# Patient Record
Sex: Male | Born: 1985 | Race: White | Hispanic: No | Marital: Single | Smoking: Never smoker
Health system: Southern US, Community
[De-identification: ages and names within clinical notes are randomized; demographics above are authoritative.]

## PROBLEM LIST (undated history)

## (undated) DIAGNOSIS — F429 Obsessive-compulsive disorder, unspecified: Secondary | ICD-10-CM

## (undated) DIAGNOSIS — R569 Unspecified convulsions: Secondary | ICD-10-CM

## (undated) DIAGNOSIS — F819 Developmental disorder of scholastic skills, unspecified: Secondary | ICD-10-CM

## (undated) DIAGNOSIS — F419 Anxiety disorder, unspecified: Secondary | ICD-10-CM

## (undated) DIAGNOSIS — F209 Schizophrenia, unspecified: Secondary | ICD-10-CM

## (undated) DIAGNOSIS — F840 Autistic disorder: Secondary | ICD-10-CM

## (undated) HISTORY — PX: TYMPANOSTOMY TUBE PLACEMENT: SHX32

## (undated) HISTORY — PX: SURGERY SCROTAL / TESTICULAR: SUR1316

## (undated) HISTORY — PX: OTHER SURGICAL HISTORY: SHX169

## (undated) HISTORY — PX: APPENDECTOMY: SHX54

## (undated) HISTORY — PX: INGUINAL HERNIA REPAIR: SUR1180

---
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
## 2007-07-23 ENCOUNTER — Ambulatory Visit (HOSPITAL_COMMUNITY): Admission: RE | Admit: 2007-07-23 | Discharge: 2007-07-23 | Payer: Self-pay | Admitting: Pediatrics

## 2010-08-03 ENCOUNTER — Emergency Department: Payer: Self-pay | Admitting: Emergency Medicine

## 2010-08-31 ENCOUNTER — Emergency Department: Payer: Self-pay | Admitting: Emergency Medicine

## 2011-04-16 ENCOUNTER — Emergency Department: Payer: Self-pay | Admitting: Internal Medicine

## 2011-06-26 ENCOUNTER — Emergency Department: Payer: Self-pay

## 2011-11-06 ENCOUNTER — Emergency Department: Payer: Self-pay | Admitting: Emergency Medicine

## 2011-11-06 LAB — CBC
HGB: 15.1 g/dL (ref 13.0–18.0)
MCV: 88 fL (ref 80–100)
Platelet: 246 10*3/uL (ref 150–440)
WBC: 7 10*3/uL (ref 3.8–10.6)

## 2011-11-06 LAB — COMPREHENSIVE METABOLIC PANEL
Alkaline Phosphatase: 90 U/L (ref 50–136)
Bilirubin,Total: 0.4 mg/dL (ref 0.2–1.0)
EGFR (African American): >60
Glucose: 165 mg/dL — ABNORMAL HIGH (ref 65–99)
SGPT (ALT): 25 U/L
Sodium: 140 mmol/L (ref 136–145)

## 2012-10-31 DIAGNOSIS — J45909 Unspecified asthma, uncomplicated: Secondary | ICD-10-CM | POA: Insufficient documentation

## 2013-01-23 ENCOUNTER — Emergency Department: Payer: Self-pay | Admitting: Emergency Medicine

## 2014-04-23 ENCOUNTER — Emergency Department: Payer: Self-pay | Admitting: Emergency Medicine

## 2014-07-20 ENCOUNTER — Emergency Department: Payer: Self-pay | Admitting: Emergency Medicine

## 2014-07-22 ENCOUNTER — Ambulatory Visit: Payer: Self-pay | Admitting: Internal Medicine

## 2014-07-24 ENCOUNTER — Emergency Department: Payer: Self-pay | Admitting: Emergency Medicine

## 2014-08-13 DIAGNOSIS — Q75009 Craniosynostosis unspecified: Secondary | ICD-10-CM | POA: Insufficient documentation

## 2014-08-13 DIAGNOSIS — Q750 Craniosynostosis: Secondary | ICD-10-CM | POA: Insufficient documentation

## 2015-02-17 ENCOUNTER — Observation Stay
Admission: EM | Admit: 2015-02-17 | Discharge: 2015-02-19 | Disposition: A | Discharge status: Home / Self Care | Payer: Medicare Other | Attending: Surgery | Admitting: Surgery

## 2015-02-17 ENCOUNTER — Encounter: Payer: Self-pay | Admitting: Emergency Medicine

## 2015-02-17 DIAGNOSIS — F840 Autistic disorder: Secondary | ICD-10-CM | POA: Diagnosis not present

## 2015-02-17 DIAGNOSIS — K3580 Unspecified acute appendicitis: Secondary | ICD-10-CM

## 2015-02-17 DIAGNOSIS — K353 Acute appendicitis with localized peritonitis, without perforation or gangrene: Secondary | ICD-10-CM | POA: Diagnosis present

## 2015-02-17 DIAGNOSIS — K5900 Constipation, unspecified: Secondary | ICD-10-CM | POA: Insufficient documentation

## 2015-02-17 DIAGNOSIS — Z882 Allergy status to sulfonamides status: Secondary | ICD-10-CM | POA: Diagnosis not present

## 2015-02-17 DIAGNOSIS — R112 Nausea with vomiting, unspecified: Secondary | ICD-10-CM | POA: Diagnosis not present

## 2015-02-17 DIAGNOSIS — Z79899 Other long term (current) drug therapy: Secondary | ICD-10-CM | POA: Insufficient documentation

## 2015-02-17 DIAGNOSIS — K429 Umbilical hernia without obstruction or gangrene: Secondary | ICD-10-CM

## 2015-02-17 DIAGNOSIS — Z9889 Other specified postprocedural states: Secondary | ICD-10-CM | POA: Diagnosis not present

## 2015-02-17 DIAGNOSIS — R1031 Right lower quadrant pain: Secondary | ICD-10-CM | POA: Diagnosis not present

## 2015-02-17 HISTORY — DX: Autistic disorder: F84.0

## 2015-02-17 LAB — CBC
HEMATOCRIT: 41.6 % (ref 40.0–52.0)
HEMOGLOBIN: 14.2 g/dL (ref 13.0–18.0)
MCH: 29.1 pg (ref 26.0–34.0)
MCHC: 34 g/dL (ref 32.0–36.0)
MCV: 85.5 fL (ref 80.0–100.0)
Platelets: 222 10*3/uL (ref 150–440)
RBC: 4.87 MIL/uL (ref 4.40–5.90)
RDW: 13.2 % (ref 11.5–14.5)
WBC: 9.7 10*3/uL (ref 3.8–10.6)

## 2015-02-17 LAB — URINALYSIS COMPLETE WITH MICROSCOPIC (ARMC ONLY)
Bilirubin Urine: NEGATIVE
GLUCOSE, UA: NEGATIVE mg/dL
Hgb urine dipstick: NEGATIVE
KETONES UR: NEGATIVE mg/dL
LEUKOCYTES UA: NEGATIVE
Nitrite: NEGATIVE
PH: 7 (ref 5.0–8.0)
Protein, ur: NEGATIVE mg/dL
Specific Gravity, Urine: 1.019 (ref 1.005–1.030)

## 2015-02-17 LAB — COMPREHENSIVE METABOLIC PANEL
ALK PHOS: 73 U/L (ref 38–126)
ALT: 14 U/L — AB (ref 17–63)
AST: 13 U/L — ABNORMAL LOW (ref 15–41)
Albumin: 3.9 g/dL (ref 3.5–5.0)
Anion gap: 8 (ref 5–15)
BILIRUBIN TOTAL: 0.6 mg/dL (ref 0.3–1.2)
BUN: 13 mg/dL (ref 6–20)
CALCIUM: 8.9 mg/dL (ref 8.9–10.3)
CO2: 28 mmol/L (ref 22–32)
Chloride: 104 mmol/L (ref 101–111)
Creatinine, Ser: 1.12 mg/dL (ref 0.61–1.24)
GFR calc Af Amer: >60 mL/min (ref >60)
Glucose, Bld: 124 mg/dL — ABNORMAL HIGH (ref 65–99)
POTASSIUM: 3.9 mmol/L (ref 3.5–5.1)
Sodium: 140 mmol/L (ref 135–145)
Total Protein: 7.2 g/dL (ref 6.5–8.1)

## 2015-02-17 LAB — LIPASE, BLOOD: LIPASE: 24 U/L (ref 22–51)

## 2015-02-17 NOTE — ED Notes (Signed)
Pt to triage via w/c with no distress noted; hx autism; Mother reports N/V last several days with right lower abd pain; denies constipation or urinary c/o

## 2015-02-18 ENCOUNTER — Emergency Department: Payer: Medicare Other

## 2015-02-18 ENCOUNTER — Observation Stay: Payer: Medicare Other | Admitting: Anesthesiology

## 2015-02-18 ENCOUNTER — Encounter
Admission: EM | Disposition: A | Discharge status: Home / Self Care | Payer: Self-pay | Source: Home / Self Care | Attending: Emergency Medicine

## 2015-02-18 DIAGNOSIS — K429 Umbilical hernia without obstruction or gangrene: Secondary | ICD-10-CM | POA: Diagnosis not present

## 2015-02-18 DIAGNOSIS — K353 Acute appendicitis with localized peritonitis, without perforation or gangrene: Secondary | ICD-10-CM | POA: Diagnosis present

## 2015-02-18 HISTORY — PX: LAPAROSCOPIC APPENDECTOMY: SHX408

## 2015-02-18 LAB — SURGICAL PCR SCREEN
MRSA, PCR: NEGATIVE
Staphylococcus aureus: NEGATIVE

## 2015-02-18 SURGERY — APPENDECTOMY, LAPAROSCOPIC
Anesthesia: General | Wound class: Contaminated

## 2015-02-18 MED ORDER — ONDANSETRON HCL 4 MG PO TABS: 4.0000 mg | ORAL_TABLET | Freq: Four times a day (QID) | ORAL | Status: DC | PRN

## 2015-02-18 MED ORDER — SUCCINYLCHOLINE CHLORIDE 20 MG/ML IJ SOLN
INTRAMUSCULAR | Status: DC | PRN
  Administered 2015-02-18: 120 mg via INTRAVENOUS

## 2015-02-18 MED ORDER — PROPOFOL 10 MG/ML IV BOLUS
INTRAVENOUS | Status: DC | PRN
  Administered 2015-02-18: 150 mg via INTRAVENOUS

## 2015-02-18 MED ORDER — BUPIVACAINE HCL 0.25 % IJ SOLN
INTRAMUSCULAR | Status: DC | PRN
  Administered 2015-02-18: 15 mL

## 2015-02-18 MED ORDER — HEPARIN SODIUM (PORCINE) 5000 UNIT/ML IJ SOLN
5000.0000 [IU] | Freq: Three times a day (TID) | INTRAMUSCULAR | Status: DC
  Administered 2015-02-18 – 2015-02-19 (×4): 5000 [IU] via SUBCUTANEOUS
  Filled 2015-02-18 (×4): qty 1

## 2015-02-18 MED ORDER — LORAZEPAM 2 MG/ML IJ SOLN
0.5000 mg | INTRAMUSCULAR | Status: DC | PRN
  Administered 2015-02-18: 0.5 mg via INTRAVENOUS
  Filled 2015-02-18: qty 1

## 2015-02-18 MED ORDER — GLYCOPYRROLATE 0.2 MG/ML IJ SOLN
INTRAMUSCULAR | Status: DC | PRN
  Administered 2015-02-18: 0.6 mg via INTRAVENOUS
  Administered 2015-02-18: 0.2 mg via INTRAVENOUS

## 2015-02-18 MED ORDER — MORPHINE SULFATE 2 MG/ML IJ SOLN
INTRAMUSCULAR | Status: AC
  Administered 2015-02-18: 2 mg via INTRAVENOUS
  Filled 2015-02-18: qty 1

## 2015-02-18 MED ORDER — LACTATED RINGERS IV SOLN
Freq: Once | INTRAVENOUS | Status: AC
  Administered 2015-02-18: 02:00:00 via INTRAVENOUS

## 2015-02-18 MED ORDER — MORPHINE SULFATE 2 MG/ML IJ SOLN
2.0000 mg | INTRAMUSCULAR | Status: DC | PRN
  Administered 2015-02-18 – 2015-02-19 (×3): 2 mg via INTRAVENOUS
  Filled 2015-02-18 (×2): qty 1

## 2015-02-18 MED ORDER — ONDANSETRON HCL 4 MG/2ML IJ SOLN
INTRAMUSCULAR | Status: AC
  Administered 2015-02-18: 4 mg via INTRAVENOUS
  Filled 2015-02-18: qty 2

## 2015-02-18 MED ORDER — DEXTROSE-NACL 5-0.9 % IV SOLN
INTRAVENOUS | Status: DC
  Administered 2015-02-18 (×2): via INTRAVENOUS

## 2015-02-18 MED ORDER — LABETALOL HCL 5 MG/ML IV SOLN
INTRAVENOUS | Status: DC | PRN
  Administered 2015-02-18: 10 mg via INTRAVENOUS

## 2015-02-18 MED ORDER — IOHEXOL 300 MG/ML  SOLN
100.0000 mL | Freq: Once | INTRAMUSCULAR | Status: AC | PRN
  Administered 2015-02-18: 100 mL via INTRAVENOUS

## 2015-02-18 MED ORDER — IOHEXOL 240 MG/ML SOLN
25.0000 mL | Freq: Once | INTRAMUSCULAR | Status: AC | PRN
  Administered 2015-02-18: 25 mL via ORAL

## 2015-02-18 MED ORDER — FENTANYL CITRATE (PF) 100 MCG/2ML IJ SOLN
INTRAMUSCULAR | Status: DC | PRN
  Administered 2015-02-18 (×2): 50 ug via INTRAVENOUS

## 2015-02-18 MED ORDER — ONDANSETRON HCL 4 MG/2ML IJ SOLN
4.0000 mg | Freq: Four times a day (QID) | INTRAMUSCULAR | Status: DC | PRN
  Administered 2015-02-18 – 2015-02-19 (×4): 4 mg via INTRAVENOUS
  Filled 2015-02-18 (×2): qty 2

## 2015-02-18 MED ORDER — PALIPERIDONE ER 3 MG PO TB24: 1.5000 mg | ORAL_TABLET | Freq: Every evening | ORAL | Status: DC

## 2015-02-18 MED ORDER — FENTANYL CITRATE (PF) 100 MCG/2ML IJ SOLN
25.0000 ug | INTRAMUSCULAR | Status: DC | PRN
Start: 2015-02-18 — End: 2015-02-19
  Administered 2015-02-18 (×3): 25 ug via INTRAVENOUS

## 2015-02-18 MED ORDER — MIDAZOLAM HCL 2 MG/2ML IJ SOLN
INTRAMUSCULAR | Status: DC | PRN
  Administered 2015-02-18: 1 mg via INTRAVENOUS

## 2015-02-18 MED ORDER — SODIUM CHLORIDE 0.9 % IV SOLN
INTRAVENOUS | Status: DC | PRN
  Administered 2015-02-18: 09:00:00 via INTRAVENOUS

## 2015-02-18 MED ORDER — ROCURONIUM BROMIDE 100 MG/10ML IV SOLN
INTRAVENOUS | Status: DC | PRN
  Administered 2015-02-18: 10 mg via INTRAVENOUS
  Administered 2015-02-18: 30 mg via INTRAVENOUS

## 2015-02-18 MED ORDER — ONDANSETRON HCL 4 MG/2ML IJ SOLN
4.0000 mg | Freq: Once | INTRAMUSCULAR | Status: AC | PRN
  Administered 2015-02-18: 4 mg via INTRAVENOUS

## 2015-02-18 MED ORDER — LACTATED RINGERS IV SOLN
INTRAVENOUS | Status: DC | PRN
  Administered 2015-02-18: 09:00:00 via INTRAVENOUS

## 2015-02-18 MED ORDER — LIDOCAINE HCL (CARDIAC) 20 MG/ML IV SOLN
INTRAVENOUS | Status: DC | PRN
  Administered 2015-02-18: 30 mg via INTRAVENOUS

## 2015-02-18 MED ORDER — PIPERACILLIN-TAZOBACTAM 3.375 G IVPB 30 MIN
3.3750 g | Freq: Three times a day (TID) | INTRAVENOUS | Status: DC
  Administered 2015-02-18 – 2015-02-19 (×4): 3.375 g via INTRAVENOUS
  Filled 2015-02-18 (×8): qty 50

## 2015-02-18 MED ORDER — NEOSTIGMINE METHYLSULFATE 10 MG/10ML IV SOLN
INTRAVENOUS | Status: DC | PRN
  Administered 2015-02-18: 3 mg via INTRAVENOUS

## 2015-02-18 SURGICAL SUPPLY — 47 items
APL SKNCLS STERI-STRIP NONHPOA (GAUZE/BANDAGES/DRESSINGS) ×1
BENZOIN TINCTURE PRP APPL 2/3 (GAUZE/BANDAGES/DRESSINGS) ×2 IMPLANT
BLADE CLIPPER SURG (BLADE) ×2 IMPLANT
CANISTER SUCT 1200ML W/VALVE (MISCELLANEOUS) ×3 IMPLANT
CATH FOL 2WAY LX 14X5 (CATHETERS) ×2 IMPLANT
CHLORAPREP W/TINT 26ML (MISCELLANEOUS) ×3 IMPLANT
CLOSURE WOUND 1/2 X4 (GAUZE/BANDAGES/DRESSINGS) ×1
CUTTER LINEAR ENDO 35 ART FLEX (STAPLE) ×3 IMPLANT
CUTTER LINEAR ENDO 35 ETS (STAPLE) ×3 IMPLANT
DECANTER SPIKE VIAL GLASS SM (MISCELLANEOUS) ×2 IMPLANT
DEFOGGER SCOPE WARMER CLEARIFY (MISCELLANEOUS) ×3 IMPLANT
DRAPE SHEET LG 3/4 BI-LAMINATE (DRAPES) ×3 IMPLANT
DRAPE UTILITY 15X26 TOWEL STRL (DRAPES) ×6 IMPLANT
DRESSING TELFA 4X3 1S ST N-ADH (GAUZE/BANDAGES/DRESSINGS) ×3 IMPLANT
DRSG TEGADERM 2-3/8X2-3/4 SM (GAUZE/BANDAGES/DRESSINGS) ×5 IMPLANT
ELECT CAUTERY BLADE 6.4 (BLADE) ×2 IMPLANT
ENDOPOUCH RETRIEVER 10 (MISCELLANEOUS) ×3 IMPLANT
GAUZE SPONGE 4X4 12PLY STRL (GAUZE/BANDAGES/DRESSINGS) ×1 IMPLANT
GLOVE BIO SURGEON STRL SZ7.5 (GLOVE) ×7 IMPLANT
GOWN STRL REUS W/ TWL LRG LVL3 (GOWN DISPOSABLE) ×1 IMPLANT
GOWN STRL REUS W/ TWL XL LVL3 (GOWN DISPOSABLE) ×1 IMPLANT
GOWN STRL REUS W/TWL LRG LVL3 (GOWN DISPOSABLE) ×3
GOWN STRL REUS W/TWL XL LVL3 (GOWN DISPOSABLE) ×3
IRRIGATION STRYKERFLOW (MISCELLANEOUS) ×1 IMPLANT
IRRIGATOR STRYKERFLOW (MISCELLANEOUS) ×3
IV NS 1000ML (IV SOLUTION) ×3
IV NS 1000ML BAXH (IV SOLUTION) ×1 IMPLANT
LABEL OR SOLS (LABEL) ×1 IMPLANT
NDL SAFETY 25GX1.5 (NEEDLE) ×3 IMPLANT
NS IRRIG 500ML POUR BTL (IV SOLUTION) ×3 IMPLANT
PACK LAP CHOLECYSTECTOMY (MISCELLANEOUS) ×3 IMPLANT
PAD GROUND ADULT SPLIT (MISCELLANEOUS) ×3 IMPLANT
PENCIL ELECTRO HAND CTR (MISCELLANEOUS) ×2 IMPLANT
RELOAD CUTTER ETS 35MM STAND (ENDOMECHANICALS) ×3 IMPLANT
SCISSORS METZENBAUM CVD 33 (INSTRUMENTS) ×3 IMPLANT
SHEARS HARMONIC ACE PLUS 36CM (ENDOMECHANICALS) ×3 IMPLANT
SLEEVE ENDOPATH XCEL 5M (ENDOMECHANICALS) ×3 IMPLANT
STRAP SAFETY BODY (MISCELLANEOUS) ×3 IMPLANT
STRIP CLOSURE SKIN 1/2X4 (GAUZE/BANDAGES/DRESSINGS) ×2 IMPLANT
SUT VIC AB 0 UR5 27 (SUTURE) ×6 IMPLANT
SUT VIC AB 4-0 RB1 27 (SUTURE) ×3
SUT VIC AB 4-0 RB1 27X BRD (SUTURE) ×1 IMPLANT
SWABSTK COMLB BENZOIN TINCTURE (MISCELLANEOUS) ×3 IMPLANT
TRAY FOLEY CATH SILVER 16FR LF (SET/KITS/TRAYS/PACK) ×4 IMPLANT
TROCAR XCEL BLUNT TIP 100MML (ENDOMECHANICALS) ×3 IMPLANT
TROCAR XCEL NON-BLD 5MMX100MML (ENDOMECHANICALS) ×3 IMPLANT
TUBING INSUFFLATOR HI FLOW (MISCELLANEOUS) ×3 IMPLANT

## 2015-02-18 NOTE — Progress Notes (Signed)
Report given to Butch Penny in [place]. Pt's family refused to sign consent until their questions were answered regarding the procedure. Butch Penny stated that consent could be signed in OR once pt's family spoke with MD and had their questions answered. Informed pt's father that MD would answer his questions once pt was transported to OR. Butch Penny requested zosyn be sent to OR with pt.

## 2015-02-18 NOTE — Transfer of Care (Signed)
Immediate Anesthesia Transfer of Care Note  Patient: Timothy Rose  Procedure(s) Performed: Procedure(s): Laparoscopic appendectomy, umbilical hernia repair (N/A)  Patient Location:    Anesthesia Type:General  Level of Consciousness: sedated and patient cooperative  Airway & Oxygen Therapy: Patient Spontanous Breathing and Patient connected to face mask oxygen  Post-op Assessment: Report given to RN  Post vital signs: Reviewed and stable  Last Vitals:  Filed Vitals:   02/18/15 1049  BP: 122/82  Pulse: 76  Temp: 36.4 C  Resp:     Complications: No apparent anesthesia complications

## 2015-02-18 NOTE — Anesthesia Procedure Notes (Signed)
Procedure Name: Intubation Date/Time: 02/18/2015 9:28 AM Performed by: Courtney Paris Pre-anesthesia Checklist: Patient identified, Emergency Drugs available, Suction available, Patient being monitored and Timeout performed Patient Re-evaluated:Patient Re-evaluated prior to inductionOxygen Delivery Method: Circle system utilized Preoxygenation: Pre-oxygenation with 100% oxygen Intubation Type: Combination inhalational/ intravenous induction Ventilation: Mask ventilation without difficulty Laryngoscope Size: McGraph and 4 Grade View: Grade III Tube type: Oral Tube size: 7.5 mm Number of attempts: 1 Airway Equipment and Method: Stylet Placement Confirmation: ETT inserted through vocal cords under direct vision,  positive ETCO2,  CO2 detector and breath sounds checked- equal and bilateral Secured at: 22 cm Tube secured with: Tape Dental Injury: Teeth and Oropharynx as per pre-operative assessment  Difficulty Due To: Difficulty was anticipated, Difficult Airway- due to anterior larynx, Difficult Airway- due to large tongue, Difficult Airway- due to limited oral opening and Difficult Airway- due to dentition Future Recommendations: Recommend- induction with short-acting agent, and alternative techniques readily available Comments: Intubated by Dr.  Ernst Bowler. Marcello Moores using [employer]

## 2015-02-18 NOTE — ED Provider Notes (Signed)
[facility] Emergency Department Provider Note  ____________________________________________  Time seen: 11:30 PM  I have reviewed the triage vital signs and the nursing notes.   HISTORY  Chief Complaint Abdominal Pain      HPI Timothy Rose is a 29 y.o. male resents with history of right lower quadrant abdominal pain times several days per patient's mother. Patient's mother denies any history of constipation no vomiting or diarrhea. Also denies any history of fever.     Past Medical History  Diagnosis Date  . Autism     There are no active problems to display for this patient.   Past Surgical History  Procedure Laterality Date  . Pyloric stenosis    . Inguinal hernia repair Left   . Surgery scrotal / testicular    . Tympanostomy tube placement    . Cranial surgery      Current Outpatient Rx  Name  Route  Sig  Dispense  Refill  . diazepam (VALIUM) 10 MG tablet   Oral   Take 10 mg by mouth 3 (three) times daily.         . paliperidone (INVEGA) 3 MG 24 hr tablet   Oral   Take 1.5 mg by mouth Nightly.           Allergies Sulfa antibiotics  No family history on file.  Social History History  Substance Use Topics  . Smoking status: Never Smoker   . Smokeless tobacco: Not on file  . Alcohol Use: No    Review of Systems  Constitutional: Negative for fever. Eyes: Negative for visual changes. ENT: Negative for sore throat. Cardiovascular: Negative for chest pain. Respiratory: Negative for shortness of breath. Gastrointestinal: Positive for abdominal pain, negative for vomiting and diarrhea. Genitourinary: Negative for dysuria. Musculoskeletal: Negative for back pain. Skin: Negative for rash. Neurological: Negative for headaches, focal weakness or numbness.   10-point ROS otherwise negative.  ____________________________________________   PHYSICAL EXAM:  VITAL SIGNS: ED Triage Vitals  Enc Vitals Group     BP  02/17/15 2124 125/88 mmHg     Pulse Rate 02/17/15 2124 100     Resp 02/17/15 2124 18     Temp 02/17/15 2124 98.9 F (37.2 C)     Temp Source 02/17/15 2124 Oral     SpO2 02/17/15 2124 95 %     Weight 02/17/15 2124 169 lb (76.658 kg)     Height 02/17/15 2124 5' 10"  (1.778 m)     Head Cir --      Peak Flow --      Pain Score 02/17/15 2126 10     Pain Loc --      Pain Edu? --      Excl. in [place]? --      Constitutional: Alert and oriented. Well appearing and in no distress. Eyes: Conjunctivae are normal. PERRL. Normal extraocular movements. ENT   Head: Normocephalic and atraumatic.   Nose: No congestion/rhinnorhea.   Mouth/Throat: Mucous membranes are moist.   Neck: No stridor. Cardiovascular: Normal rate, regular rhythm. Normal and symmetric distal pulses are present in all extremities. No murmurs, rubs, or gallops. Respiratory: Normal respiratory effort without tachypnea nor retractions. Breath sounds are clear and equal bilaterally. No wheezes/rales/rhonchi. Gastrointestinal: Right lower quadrant pain with palpation.  No distention. There is no CVA tenderness. Genitourinary: deferred Musculoskeletal: Nontender with normal range of motion in all extremities. No joint effusions.  No lower extremity tenderness nor edema. Neurologic:  Normal speech and language.  No gross focal neurologic deficits are appreciated. Speech is normal.  Skin:  Skin is warm, dry and intact. No rash noted. Psychiatric: Mood and affect are normal. Speech and behavior are normal. Patient exhibits appropriate insight and judgment.  ____________________________________________    LABS (pertinent positives/negatives)  Labs Reviewed  COMPREHENSIVE METABOLIC PANEL - Abnormal; Notable for the following:    Glucose, Bld 124 (*)    AST 13 (*)    ALT 14 (*)    All other components within normal limits  URINALYSIS COMPLETEWITH MICROSCOPIC ([facility] ONLY) - Abnormal; Notable for the following:    Color,  Urine YELLOW (*)    APPearance CLEAR (*)    Bacteria, UA RARE (*)    Squamous Epithelial / LPF 0-5 (*)    All other components within normal limits  LIPASE, BLOOD  CBC         RADIOLOGY  CT scan of the abdomen revealed: Acute appendicitis     INITIAL IMPRESSION / ASSESSMENT AND PLAN / ED COURSE  Pertinent labs & imaging results that were available during my care of the patient were reviewed by me and considered in my medical decision making (see chart for details).  History physical exam and CT of the abdomen consistent with acute appendicitis as such patient was discussed with Dr. Burt Knack general surgeon on call with plan for admission for appendectomy. I discussed all clinical findings and plan with the patient's mother.  ____________________________________________   FINAL CLINICAL IMPRESSION(S) / ED DIAGNOSES  Final diagnoses:  None      Gregor Hams, MD 02/18/15 [phone]

## 2015-02-18 NOTE — Progress Notes (Signed)
Telephone discussion with Dr. Alvin Critchley from anesthesia concerning this patient's care. It is my concern that with this patient who is had significant medical problems and in fact had a craniotomy most importantly that he has a small mouth and a large tongue, he may be a difficult intubation. Reviewed this with Dr. Kayleen Memos who is in agreement that performing this at 3 in the morning may be very difficult and dangerous and that an awake intubation in a patient with autism can be extremely difficult as well. He suggested and agreed with my plan that doing this in a controlled situation where there would be 20 of help in the operating room was more prudent. He agreed with this plan and will notify the anesthesia team in the morning concerning this patient's potential for difficult intubation. I will review this with Dr. Marina Gravel as well

## 2015-02-18 NOTE — Progress Notes (Signed)
Patient ID: Timothy Rose, male   DOB: September 28, 1985, 29 y.o.   MRN: [id number]  Patient and father seen in holding area  Personally reviewed CT scan images  Spoke with Dr Burt Knack this am  Plan laparoscopic appendectomy this am  All questions addressed.

## 2015-02-18 NOTE — ED Notes (Signed)
Report attempted 

## 2015-02-18 NOTE — Anesthesia Postprocedure Evaluation (Signed)
  Anesthesia Post-op Note  Patient: Timothy Rose  Procedure(s) Performed: Procedure(s): Laparoscopic appendectomy, umbilical hernia repair (N/A)  Anesthesia type:General  Patient location: PACU  Post pain: Pain level controlled  Post assessment: Post-op Vital signs reviewed, Patient's Cardiovascular Status Stable, Respiratory Function Stable, Patent Airway and No signs of Nausea or vomiting  Post vital signs: Reviewed and stable  Last Vitals:  Filed Vitals:   02/18/15 1549  BP: 127/77  Pulse: 88  Temp:   Resp: 18    Level of consciousness: awake, alert  and patient cooperative  Complications: No apparent anesthesia complications

## 2015-02-18 NOTE — Anesthesia Preprocedure Evaluation (Signed)
Anesthesia Evaluation  Patient identified by MRN, date of birth, ID band Patient awake    Reviewed: Allergy & Precautions, NPO status , Patient's Chart, lab work & pertinent test results, reviewed documented beta blocker date and time   Airway Mallampati: III  TM Distance: >3 FB     Dental  (+) Chipped, Poor Dentition   Pulmonary          Cardiovascular     Neuro/Psych PSYCHIATRIC DISORDERS Schizophrenia    GI/Hepatic   Endo/Other    Renal/GU      Musculoskeletal   Abdominal   Peds  Hematology   Anesthesia Other Findings Overbite. Anticipate difficult intubation as he has a narrow mouth. Autism but can communicate fairly well.  Reproductive/Obstetrics                             Anesthesia Physical Anesthesia Plan  ASA: III  Anesthesia Plan: General   Post-op Pain Management:    Induction: Intravenous  Airway Management Planned: Oral ETT  Additional Equipment:   Intra-op Plan:   Post-operative Plan:   Informed Consent: I have reviewed the patients History and Physical, chart, labs and discussed the procedure including the risks, benefits and alternatives for the proposed anesthesia with the patient or authorized representative who has indicated his/her understanding and acceptance.     Plan Discussed with: CRNA  Anesthesia Plan Comments:         Anesthesia Quick Evaluation

## 2015-02-18 NOTE — Op Note (Signed)
02/17/2015 - 02/18/2015  10:26 AM  PATIENT:  Timothy Rose  28 y.o. male  PRE-OPERATIVE DIAGNOSIS:  acute appendicitis, umbilical hernia  POST-OPERATIVE DIAGNOSIS:  acute appendicitis, umbilical hernia  PROCEDURE:  Procedure(s): Laparoscopic appendectomy, umbilical hernia repair (N/A)  SURGEON:  Surgeon(s) and Role:    * Sherri Rad, MD - Primary  ASSISTANTS: none  ANESTHESIA: general   SPECIMEN: appendix    EBL: minimal.  Description of procedure:    With informed consent obtained from the parents the patient is brought to the operating room and positioned supine. Gen. endotracheal anesthesia was induced. Foley catheter was placed. The patient's abdomen was clipped of hair sterilely prepped and draped core prep solution and a time out was observed.  An infraumbilical transversely skin incision was fashion with scalpel. The umbilical hernia was encircled with blunt technique around the umbilical stalk. Hernia sac was transected off the dermis. This is accomplished with electrocautery the fascia was identified. The peritoneum was entered sharply. Stay sutures were passed on either side of the abdominal fascial defect. The hernia defect was large enough to admit a Hassan without incising the fascia. Pneumoperitoneum was established. The patient was positioned in Trendelenburg and airplane right side up. A 5 mm blade less trocar was placed in the right upper quadrant. A 5 mm bladeless trocar in the left lower quadrant.  The appendix was identified in the right lower quadrant. The appendix was elevated towards the anterior abdominal wall and the mesial appendix was divided with the harmonic scalpel apparatus was advanced hemostasis feature. The confluence of the tinea was identified. Utilizing an  articulating GIA stapler with blue load application two loads were fired across the base the appendix. The specimen was captured in an Endo Catch device and retrieved. Point hemostasis was obtained  with electrocautery along the staple line at 2 points. The right lower quadrant was irrigated with several 100 cc's of warm normal saline and aspirated dry. Hemostasis appeared be adequate on the operative field was no evidence of bowel injury with trocar insertion. Ports are then removed under direct visualization.   The hernia defect was reapproximated utilizing a figure-of-eight number oh Vicryls suture in vertical orientation without any undue tension. A total of 20 cc of 0.25% plain Marcaine was infiltrated along all skin fascial incisions prior to closure. 4-0 Vicryl subcuticular was used to approximate skin edges. Benzoin Steri-Strips Telfa and Tegaderm were then applied. The patient was subsequently extubated to the recovery room in stable condition and Foley catheter was removed.

## 2015-02-18 NOTE — ED Notes (Signed)
CT notified that pt finished with contrast.

## 2015-02-18 NOTE — Progress Notes (Signed)
Pharmacy called to notify me that they do not carry Invega 16m. Pharmacy requests pt's family bring in his home medication. Notified pt's mother. She states that pt is being weaned off of this medication and that he will no longer be taking it soon. Notified Dr. BMarina Gravel New orders to d/c medication.

## 2015-02-18 NOTE — Progress Notes (Signed)
Dr. Burt Knack notified of pt having increased anxiety related to procedure tomorrow. Requesting for med to help relax. MD ordered 0.5 mg of ativan IVP q4h PRN.

## 2015-02-18 NOTE — Progress Notes (Signed)
Patient ID: Timothy Rose, male   DOB: April 03, 1986, 30 y.o.   MRN: [id number]   Patient resting comfortably. Spoke with father and mother. He tolerated his clear liquid diet. Pain seems to be well controlled. Plan for continuation of intravenous antibiotics and discharged tomorrow afternoon. Parents were in agreement with this plan.  I have discussed with them the operative findings.

## 2015-02-18 NOTE — H&P (Signed)
Timothy Rose is an 29 y.o. male.    Chief Complaint: Abdominal pain  HPI: This 29 year old male accompanied by his mother he has considerably severe autism and history is obtained from the mother. Mom states that she is not sure how long he's been sick but he had a fever to 101 2 days ago. She has a difficult time determining that time at which is symptoms started because he frequently has abdominal pain due to constipation but she states that he stopped eating yesterday and that's why she brought him to the hospital. She states she's had frequent abdominal pain episodes frequently related to constipation but has never had a fever like he did 2 days ago.  Of significance besides his autism he has had a craniotomy posteriorly for stenosis. He also had a pyloromyotomy as an infant and an undescended testicle and hernia repaired.  Past Medical History  Diagnosis Date  . Autism     Past Surgical History  Procedure Laterality Date  . Pyloric stenosis    . Inguinal hernia repair Left   . Surgery scrotal / testicular    . Tympanostomy tube placement    . Cranial surgery      No family history on file. Social History:  reports that he has never smoked. He does not have any smokeless tobacco history on file. He reports that he does not drink alcohol. His drug history is not on file.  Allergies:  Allergies  Allergen Reactions  . Sulfa Antibiotics      (Not in a hospital admission)   Review of Systems  Unable to perform ROS: medical condition     Physical Exam:  BP 145/100 mmHg  Pulse 88  Temp(Src) 98.5 F (36.9 C) (Oral)  Resp 18  Ht 5' 10"  (1.778 m)  Wt 169 lb (76.658 kg)  BMI 24.25 kg/m2  SpO2 100%  Physical Exam  Constitutional: He is well-developed, well-nourished, and in no distress. No distress.  HENT:  Head: Normocephalic and atraumatic.  Patient has a very small mouth with a large tongue protruding as well as teeth protruding his mucous membranes are very  dry  Eyes: No scleral icterus.  Neck: Normal range of motion. Neck supple.  Cardiovascular: Normal rate, regular rhythm and normal heart sounds.   Pulmonary/Chest: No respiratory distress. He has no wheezes. He has no rales.  Abdominal: Soft. He exhibits no distension. There is tenderness. There is no rebound and no guarding.  Patient has a nonreducible umbilical hernia.  Abdomen is soft and minimally tender in the right lower quadrant with a negative Rovsing sign no guarding no rebound no percussion tenderness  Genitourinary: Penis normal.  Musculoskeletal: Normal range of motion. He exhibits no edema.  Lymphadenopathy:    He has no cervical adenopathy.  Neurological: He is alert.  Skin: Skin is warm and dry. No erythema.  Psychiatric:  Patient is autistic he is frightened and voices being scared        Results for orders placed or performed during the hospital encounter of 02/17/15 (from the past 48 hour(s))  Lipase, blood     Status: None   Collection Time: 02/17/15  9:33 PM  Result Value Ref Range   Lipase 24 22 - 51 U/L  Comprehensive metabolic panel     Status: Abnormal   Collection Time: 02/17/15  9:33 PM  Result Value Ref Range   Sodium 140 135 - 145 mmol/L   Potassium 3.9 3.5 - 5.1 mmol/L   Chloride  104 101 - 111 mmol/L   CO2 28 22 - 32 mmol/L   Glucose, Bld 124 (H) 65 - 99 mg/dL   BUN 13 6 - 20 mg/dL   Creatinine, Ser 1.12 0.61 - 1.24 mg/dL   Calcium 8.9 8.9 - 10.3 mg/dL   Total Protein 7.2 6.5 - 8.1 g/dL   Albumin 3.9 3.5 - 5.0 g/dL   AST 13 (L) 15 - 41 U/L   ALT 14 (L) 17 - 63 U/L   Alkaline Phosphatase 73 38 - 126 U/L   Total Bilirubin 0.6 0.3 - 1.2 mg/dL   GFR calc non Af Amer >60 >60 mL/min   GFR calc Af Amer >60 >60 mL/min    Comment: (NOTE) The eGFR has been calculated using the CKD EPI equation. This calculation has not been validated in all clinical situations. eGFR's persistently <60 mL/min signify possible Chronic Kidney Disease.    Anion gap  8 5 - 15  CBC     Status: None   Collection Time: 02/17/15  9:33 PM  Result Value Ref Range   WBC 9.7 3.8 - 10.6 K/uL   RBC 4.87 4.40 - 5.90 MIL/uL   Hemoglobin 14.2 13.0 - 18.0 g/dL   HCT 41.6 40.0 - 52.0 %   MCV 85.5 80.0 - 100.0 fL   MCH 29.1 26.0 - 34.0 pg   MCHC 34.0 32.0 - 36.0 g/dL   RDW 13.2 11.5 - 14.5 %   Platelets 222 150 - 440 K/uL  Urinalysis complete, with microscopic ([facility] only)     Status: Abnormal   Collection Time: 02/17/15  9:34 PM  Result Value Ref Range   Color, Urine YELLOW (A) YELLOW   APPearance CLEAR (A) CLEAR   Glucose, UA NEGATIVE NEGATIVE mg/dL   Bilirubin Urine NEGATIVE NEGATIVE   Ketones, ur NEGATIVE NEGATIVE mg/dL   Specific Gravity, Urine 1.019 1.005 - 1.030   Hgb urine dipstick NEGATIVE NEGATIVE   pH 7.0 5.0 - 8.0   Protein, ur NEGATIVE NEGATIVE mg/dL   Nitrite NEGATIVE NEGATIVE   Leukocytes, UA NEGATIVE NEGATIVE   RBC / HPF 0-5 0 - 5 RBC/hpf   WBC, UA 0-5 0 - 5 WBC/hpf   Bacteria, UA RARE (A) NONE SEEN   Squamous Epithelial / LPF 0-5 (A) NONE SEEN   Dg Chest 2 View  02/18/2015   CLINICAL DATA:  Coughing for 3 4 days.  EXAM: CHEST  2 VIEW  COMPARISON:  None.  FINDINGS: The heart size and mediastinal contours are within normal limits. Both lungs are clear. The visualized skeletal structures are unremarkable.  IMPRESSION: No active cardiopulmonary disease.   Electronically Signed   By: Andreas Newport M.D.   On: 02/18/2015 00:52   Ct Abdomen Pelvis W Contrast  02/18/2015   CLINICAL DATA:  Nausea vomiting for several days. Right lower quadrant abdominal pain.  EXAM: CT ABDOMEN AND PELVIS WITH CONTRAST  TECHNIQUE: Multidetector CT imaging of the abdomen and pelvis was performed using the standard protocol following bolus administration of intravenous contrast.  CONTRAST:  171m OMNIPAQUE IOHEXOL 300 MG/ML  SOLN  COMPARISON:  06/26/2011  FINDINGS: There are acute inflammatory changes surrounding the appendix. There is no drainable abscess but there is  substantial soft tissue induration around the appendix suggesting phlegmon formation. No extraluminal air.  There are normal appearances of the liver, gallbladder, bile ducts, pancreas, spleen, adrenals and kidneys. There is a generous volume of stool in the colon and rectum. There is a fat containing umbilical  hernia. There is no significant musculoskeletal lesion. Lower chest is negative for significant abnormality.  IMPRESSION: Acute appendicitis. These results were called by telephone at the time of interpretation on 02/18/2015 at 1:18 am to Dr. Marjean Donna , who verbally acknowledged these results.   Electronically Signed   By: Andreas Newport M.D.   On: 02/18/2015 01:20     Assessment/Plan  CT scan is personally reviewed and white blood cell count is normal. Of note the patient had a fever 2 days ago to 101 but his appendix does not appear ruptured on CT scan.  I discussed with his mother who is his caregiver should the patient lives with her. He will need a laparoscopic appendectomy I do not believe that the umbilical hernia can be repaired at the same time due to the risk of infection. She had asked About this.  Patient is been vomiting and his mucous membranes and oropharynx are extremely dry I do believe this patient would benefit from IV anabiotic's and IV hydration at this point in addition I concerns about him being a difficult intubation at 3 in the morning without adequate staff Barbarann Ehlers states that hydration and antibiotic's and temporizing until a full operating room staff with assistance for intubation could be obtained. This is discussed with his mother and he will be admitted to the hospital hydrated IV anabiotic's will be started pain control and nausea control will be instituted.  Florene Glen, MD, FACS

## 2015-02-19 DIAGNOSIS — K353 Acute appendicitis with localized peritonitis: Secondary | ICD-10-CM | POA: Diagnosis not present

## 2015-02-19 LAB — SURGICAL PATHOLOGY

## 2015-02-19 MED ORDER — HYDROCODONE-ACETAMINOPHEN 5-325 MG PO TABS: 1.0000 | ORAL_TABLET | Freq: Four times a day (QID) | ORAL | Status: DC | PRN

## 2015-02-19 NOTE — Progress Notes (Signed)
Patient will discharge home.  No social work needs at this time.  Timothy Rose. Latanya Presser, MSW Clinical Social Work Department Emergency Room [phone] 9:44 AM

## 2015-02-19 NOTE — Discharge Summary (Signed)
Physician Discharge Summary  Patient ID: Timothy Rose MRN: [id number] DOB/AGE: [birth year] 29 y.o.  Admit date: 02/17/2015 Discharge date: 02/19/2015  Admission Diagnoses: Acute appendicitis with localized peritonitis. Umbilical hernia. Autism.  Discharge Diagnoses:  Active Problems:   Acute appendicitis with localized peritonitis   Umbilical hernia   Discharged Condition: Stable and improved  Hospital Course: The patient was admitted to the hospital and started on intravenous antibiotics and IV fluids. Due to the potential difficult intubation the patient's surgery was done until dayshift. He underwent an uneventful intubation followed by an uneventful laparoscopic appendectomy. Postoperatively he was maintained on intravenous Zosyn. His pain was well-controlled his diet was able to be advanced and he was discharged home on postoperative day #1.   Significant Diagnostic Studies: CT scan abdomen and pelvis  Treatments: Upper scopic appendectomy   Disposition: home with self-care parents.   Discharge Instructions    Call MD for:  persistant nausea and vomiting    Complete by:  As directed      Call MD for:  redness, tenderness, or signs of infection (pain, swelling, redness, odor or green/yellow discharge around incision site)    Complete by:  As directed      Call MD for:  severe uncontrolled pain    Complete by:  As directed      Call MD for:  temperature >100.4    Complete by:  As directed      Diet general    Complete by:  As directed      Discharge instructions    Complete by:  As directed   DISCHARGE INSTRUCTIONS TO PATIENT  REMINDER:  Carry a list of your medications and allergies with you at all times Call your pharmacy at least 1 week in advance to refill prescriptions Do not mix any prescribed pain medicine with alcohol Do not drive any motor vehicles while taking pain medication. Take medications with food.  Do not retake a pain medication if you vomit after  taking it.  Activity: no lifting more than 15 pounds until instructed by your doctor.   Dressing Care Instruction (if applicable):              Remove operative dressings in 48 hours.  May Shower-  Call office if any questions regarding this activity.  Dry Dressing as needed to operative site.  Drain care instructions provided to you in the hospital.   Follow-up appointments (date to return to physician): Call for appointment with Dr. Sherri Rad, MD at [phone] or [phone]  If need MD on call after hours and on weekends call Hospital operator at [phone] as ask to speak to Surgeon on call for [facility].  Call Surgeon if you have: Temperature greater than 100.4 Persistent nausea and vomiting Severe uncontrolled pain Redness, tenderness, or signs of infection (pain, swelling, redness, odor or green/yellow discharge around the site) Difficulty breathing, headache or visual disturbances Hives Persistent dizziness or light-headedness Extreme fatigue Any other questions or concerns you may have after discharge  In an emergency, call 911 or go to an Emergency Department at a nearby hospital  Diet:  Resume your usual diet.  Avoid spicy, greasy or heavy foods.  If you have nausea or vomiting, go back to liquids.  If you cannot keep liquids down, call your doctor.  Avoid alcohol consumption while on prescription pain medications. Good nutrition promotes healing. Increase fiber and fluids.     I understand and acknowledge receipt of the above instructions.  Patient or Guardian Signature                                                                    Date/Time                                                                                                                                        Physician's or R.N.'s Signature                                                                   Date/Time  The discharge instructions have been reviewed with the patient and/or Family Member/Parent/Guardian.  Patient and/or Family Member/Parent/Guardian signed and retained a printed copy.     Increase activity slowly    Complete by:  As directed      Remove dressing in 48 hours    Complete by:  As directed             Medication List    TAKE these medications        diazepam 10 MG tablet  Commonly known as:  VALIUM  Take 10 mg by mouth 3 (three) times daily.     HYDROcodone-acetaminophen 5-325 MG per tablet  Commonly known as:  NORCO  Take 1-2 tablets by mouth every 6 (six) hours as needed for moderate pain.     INVEGA 1.5 MG Tb24  Generic drug:  Paliperidone  Take 1.5 mg by mouth at bedtime.     Melatonin 10 MG Tabs  Take 10 mg by mouth at bedtime as needed.           Follow-up Information    Follow up On 02/24/2015.   Why:  For wound re-check      Signed: Sherri Rad 02/19/2015, 10:26 AM

## 2015-02-19 NOTE — Progress Notes (Signed)
Pt d/c'd to home today. Mother at bedside. IV removed & intact. Pt's discharge paperwork reviewed with his mother all questions & concerns addressed. Rx's handed to pt's mother and medication education reviewed with all questions & concerns addressed. Infection prevention education presented with all questions and concerns addressed. Volunteer services utilized to transport pt downstairs via wheelchair.

## 2015-02-19 NOTE — Progress Notes (Signed)
Patient ID: Timothy Rose, male   DOB: 11-Aug-1985, 29 y.o.   MRN: [id number]   He is tolerating a regular diet. He has passed gas. There is been no fevers.  I discussed the situation with his mother who is at the bedside.  Filed Vitals:   02/18/15 1406 02/18/15 1549 02/19/15 0004 02/19/15 0816  BP: 117/79 127/77 118/75 121/89  Pulse: 79 88 83 93  Temp: 97.3 F (36.3 C)  99.6 F (37.6 C) 98.8 F (37.1 C)  TempSrc: Oral  Oral Oral  Resp: 18 18 16 18   Height:      Weight:      SpO2: 97% 93% 93% 91%    His abdomen is soft and nontender.  Dressings are dry and intact.  Impression doing well postop day 1 status post laparoscopic appendectomy.  Plan discharge later today follow-up in the office next week.

## 2015-02-19 NOTE — Discharge Instructions (Signed)
Appendicitis Appendicitis is when the appendix is swollen (inflamed). The inflammation can lead to developing a hole (perforation) and a collection of pus (abscess). CAUSES  There is not always an obvious cause of appendicitis. Sometimes it is caused by an obstruction in the appendix. The obstruction can be caused by: 1. A small, hard, pea-sized ball of stool (fecalith). 2. Enlarged lymph glands in the appendix. SYMPTOMS  1. Pain around your belly button (navel) that moves toward your lower right belly (abdomen). The pain can become more severe and sharp as time passes. 2. Tenderness in the lower right abdomen. Pain gets worse if you cough or make a sudden movement. 3. Feeling sick to your stomach (nauseous). 4. Throwing up (vomiting). 5. Loss of appetite. 6. Fever. 7. Constipation. 8. Diarrhea. 9. Generally not feeling well. DIAGNOSIS   Physical exam.  Blood tests.  Urine test.  X-rays or a CT scan may confirm the diagnosis. TREATMENT  Once the diagnosis of appendicitis is made, the most common treatment is to remove the appendix as soon as possible. This procedure is called appendectomy. In an open appendectomy, a cut (incision) is made in the lower right abdomen and the appendix is removed. In a laparoscopic appendectomy, usually 3 small incisions are made. Long, thin instruments and a camera tube are used to remove the appendix. Most patients go home in 24 to 48 hours after appendectomy. In some situations, the appendix may have already perforated and an abscess may have formed. The abscess may have a "wall" around it as seen on a CT scan. In this case, a drain may be placed into the abscess to remove fluid, and you may be treated with antibiotic medicines that kill germs. The medicine is given through a tube in your vein (IV). Once the abscess has resolved, it may or may not be necessary to have an appendectomy. You may need to stay in the hospital longer than 48 hours. Document  Released: 07/03/2005 Document Revised: 01/02/2012 Document Reviewed: 09/28/2009 [facility] Patient Information 2015 [place], [place]. This information is not intended to replace advice given to you by your health care provider. Make sure you discuss any questions you have with your health care provider.   DISCHARGE INSTRUCTIONS TO PATIENT  REMINDER:   Carry a list of your medications and allergies with you at all times  Call your pharmacy at least 1 week in advance to refill prescriptions  Do not mix any prescribed pain medicine with alcohol  Do not drive any motor vehicles while taking pain medication.  Take medications with food.  Do not retake a pain medication if you vomit after taking it.  Activity: no lifting more than 15 pounds until instructed by your doctor.   Dressing Care Instruction (if applicable):              Remove operative dressings in 48 hours.  May Shower-  Call office if any questions regarding this activity.  Dry Dressing as needed to operative site.  Drain care instructions provided to you in the hospital.   Follow-up appointments (date to return to physician): Call for appointment with Dr. Sherri Rad, MD at [phone] or [phone]  If need MD on call after hours and on weekends call Hospital operator at [phone] as ask to speak to Surgeon on call for [facility].  Call Surgeon if you have:  Temperature greater than 100.4  Persistent nausea and vomiting  Severe uncontrolled pain  Redness, tenderness, or signs of infection (pain, swelling,  redness, odor or green/yellow discharge around the site)  Difficulty breathing, headache or visual disturbances  Hives  Persistent dizziness or light-headedness  Extreme fatigue  Any other questions or concerns you may have after discharge  In an emergency, call 911 or go to an Emergency Department at a nearby hospital  Diet:  Resume your usual diet.  Avoid spicy, greasy or heavy foods.   If you have nausea or vomiting, go back to liquids.  If you cannot keep liquids down, call your doctor.  Avoid alcohol consumption while on prescription pain medications. Good nutrition promotes healing. Increase fiber and fluids.

## 2015-02-25 ENCOUNTER — Ambulatory Visit (INDEPENDENT_AMBULATORY_CARE_PROVIDER_SITE_OTHER): Payer: Medicaid Other | Admitting: Surgery

## 2015-02-25 ENCOUNTER — Encounter: Payer: Self-pay | Admitting: Surgery

## 2015-02-25 VITALS — BP 129/89 | HR 77 | Temp 98.8°F | Ht 70.0 in | Wt 169.8 lb

## 2015-02-25 DIAGNOSIS — K429 Umbilical hernia without obstruction or gangrene: Secondary | ICD-10-CM

## 2015-02-25 DIAGNOSIS — K353 Acute appendicitis with localized peritonitis, without perforation or gangrene: Secondary | ICD-10-CM

## 2015-02-25 DIAGNOSIS — F840 Autistic disorder: Secondary | ICD-10-CM

## 2015-02-25 NOTE — Patient Instructions (Signed)
Please call our office with any questions or concerns.

## 2015-03-01 NOTE — Progress Notes (Signed)
Surgery  The patient is approximatly 1 week status post laparoscopic appendectomy. He has a history of autism. He has had an uneventful postoperative course. He does have chronic constipation for which his mother has been given and MiraLAX as per normal. There is been no change in appetite and no fever.  He is accompanied by his mother.  He had a small umbilical hernia which I repaired primarily at the time of closure.  Filed Vitals:   02/25/15 1447  BP: 129/89  Pulse: 77  Temp: 98.8 F (37.1 C)  TempSrc: Oral  Height: 5' 10"  (1.778 m)  Weight: 169 lb 12.8 oz (77.021 kg)    PE:  The patient's abdomen is soft and minimally tender. Incisions are nicely healed. There is no wound drainage. I cannot appreciate a recurrent umbilical hernia.  IMP:  He is doing very well.  Plan:  Activity and diet as tolerated. Copy of the pathology report was given to the mother. We will see him back in the office as needed.

## 2015-03-03 ENCOUNTER — Encounter: Payer: Self-pay | Admitting: Emergency Medicine

## 2015-03-03 ENCOUNTER — Other Ambulatory Visit: Payer: Self-pay

## 2015-03-03 ENCOUNTER — Emergency Department: Payer: Medicare Other

## 2015-03-03 ENCOUNTER — Emergency Department
Admission: EM | Admit: 2015-03-03 | Discharge: 2015-03-03 | Disposition: A | Discharge status: Home / Self Care | Payer: Medicare Other | Attending: Emergency Medicine | Admitting: Emergency Medicine

## 2015-03-03 DIAGNOSIS — R230 Cyanosis: Secondary | ICD-10-CM | POA: Diagnosis present

## 2015-03-03 DIAGNOSIS — I7300 Raynaud's syndrome without gangrene: Secondary | ICD-10-CM | POA: Insufficient documentation

## 2015-03-03 LAB — CBC WITH DIFFERENTIAL/PLATELET
Basophils Absolute: 0.1 10*3/uL (ref 0–0.1)
Basophils Relative: 1 %
EOS PCT: 1 %
Eosinophils Absolute: 0 10*3/uL (ref 0–0.7)
HCT: 45.4 % (ref 40.0–52.0)
Hemoglobin: 15 g/dL (ref 13.0–18.0)
LYMPHS ABS: 3.1 10*3/uL (ref 1.0–3.6)
LYMPHS PCT: 39 %
MCH: 28.5 pg (ref 26.0–34.0)
MCHC: 33.1 g/dL (ref 32.0–36.0)
MCV: 86 fL (ref 80.0–100.0)
MONO ABS: 0.5 10*3/uL (ref 0.2–1.0)
Monocytes Relative: 6 %
Neutro Abs: 4.2 10*3/uL (ref 1.4–6.5)
Neutrophils Relative %: 53 %
PLATELETS: 327 10*3/uL (ref 150–440)
RBC: 5.28 MIL/uL (ref 4.40–5.90)
RDW: 13.6 % (ref 11.5–14.5)
WBC: 7.9 10*3/uL (ref 3.8–10.6)

## 2015-03-03 LAB — COMPREHENSIVE METABOLIC PANEL
ALK PHOS: 66 U/L (ref 38–126)
ALT: 25 U/L (ref 17–63)
AST: 25 U/L (ref 15–41)
Albumin: 4.8 g/dL (ref 3.5–5.0)
Anion gap: 10 (ref 5–15)
BUN: 14 mg/dL (ref 6–20)
CALCIUM: 9.3 mg/dL (ref 8.9–10.3)
CHLORIDE: 99 mmol/L — AB (ref 101–111)
CO2: 28 mmol/L (ref 22–32)
CREATININE: 1.43 mg/dL — AB (ref 0.61–1.24)
GFR calc Af Amer: >60 mL/min (ref >60)
GFR calc non Af Amer: >60 mL/min (ref >60)
Glucose, Bld: 119 mg/dL — ABNORMAL HIGH (ref 65–99)
Potassium: 3.9 mmol/L (ref 3.5–5.1)
Sodium: 137 mmol/L (ref 135–145)
Total Bilirubin: 0.7 mg/dL (ref 0.3–1.2)
Total Protein: 7.6 g/dL (ref 6.5–8.1)

## 2015-03-03 LAB — TROPONIN I: Troponin I: <0.03 ng/mL (ref <0.031)

## 2015-03-03 MED ORDER — IOHEXOL 350 MG/ML SOLN
75.0000 mL | Freq: Once | INTRAVENOUS | Status: AC | PRN
  Administered 2015-03-03: 75 mL via INTRAVENOUS

## 2015-03-03 NOTE — ED Provider Notes (Addendum)
[facility] Emergency Department Provider Note     Time seen: ----------------------------------------- 2:40 PM on 03/03/2015 -----------------------------------------    I have reviewed the triage vital signs and the nursing notes.   HISTORY  Chief Complaint Circulatory Problem    HPI Timothy Rose is a 29 y.o. male who presents ER being brought by mom stating that intermittently his body turns purple. This is noted particularly in his arm she states. She has noted over the last month, and is certain that there is a circulatory problem that is present. He has had this appendix taken out about a week ago, has not been any other recent fever or chills. He did have an episode of vomiting today. She has not been able to tie this event to any particular activity.   Past Medical History  Diagnosis Date  . Autism     Patient Active Problem List   Diagnosis Date Noted  . Autism 03/01/2015  . Acute appendicitis with localized peritonitis 02/18/2015  . Umbilical hernia 96/28/3662    Past Surgical History  Procedure Laterality Date  . Pyloric stenosis    . Inguinal hernia repair Left   . Surgery scrotal / testicular    . Tympanostomy tube placement    . Cranial surgery    . Laparoscopic appendectomy N/A 02/18/2015    Procedure: Laparoscopic appendectomy, umbilical hernia repair;  Surgeon: Sherri Rad, MD;  Location: [facility] ORS;  Service: General;  Laterality: N/A;  . Appendectomy      Allergies Haldol; Sulfa antibiotics; and Viibryd  Social History Social History  Substance Use Topics  . Smoking status: Never Smoker   . Smokeless tobacco: Never Used  . Alcohol Use: No    Review of Systems Constitutional: Negative for fever. Eyes: Negative for visual changes. ENT: Negative for sore throat. Cardiovascular: Negative for chest pain. Respiratory: Negative for shortness of breath. Gastrointestinal: Negative for abdominal pain, positive for  vomiting Genitourinary: Negative for dysuria. Musculoskeletal: Negative for back pain. Skin: Positive for skin discoloration Neurological: Negative for headaches, focal weakness or numbness.  10-point ROS otherwise negative.  ____________________________________________   PHYSICAL EXAM:  VITAL SIGNS: ED Triage Vitals  Enc Vitals Group     BP 03/03/15 1249 129/89 mmHg     Pulse Rate 03/03/15 1249 94     Resp 03/03/15 1249 18     Temp 03/03/15 1249 98.1 F (36.7 C)     Temp Source 03/03/15 1249 Oral     SpO2 03/03/15 1249 100 %     Weight 03/03/15 1249 169 lb (76.658 kg)     Height 03/03/15 1249 5' 10"  (1.778 m)     Head Cir --      Peak Flow --      Pain Score 03/03/15 1249 10     Pain Loc --      Pain Edu? --      Excl. in [place]? --     Constitutional: Alert but disoriented. Well appearing and in no distress. Eyes: Conjunctivae are normal. PERRL.  ENT   Head: atraumatic.   Nose: No congestion/rhinnorhea.   Mouth/Throat: Mucous membranes are moist.   Neck: No stridor. Cardiovascular: Normal rate, regular rhythm. Normal and symmetric distal pulses are present in all extremities. No murmurs, rubs, or gallops. Respiratory: Normal respiratory effort without tachypnea nor retractions. Breath sounds are clear and equal bilaterally. No wheezes/rales/rhonchi. Gastrointestinal: Soft and nontender. No distention. No abdominal bruits.  Musculoskeletal: Nontender with normal range of motion in all  extremities. No joint effusions.  No lower extremity tenderness nor edema. Neurologic:  Normal speech and language.  Skin:  Patient does have erythematous skin in several locations, no cyanosis is present  ____________________________________________  ED COURSE:  Pertinent labs & imaging results that were available during my care of the patient were reviewed by me and considered in my medical decision making (see chart for details). We'll check cardiac labs, consider CT  angiogram of the chest. ____________________________________________    LABS (pertinent positives/negatives)  Labs Reviewed  COMPREHENSIVE METABOLIC PANEL - Abnormal; Notable for the following:    Chloride 99 (*)    Glucose, Bld 119 (*)    Creatinine, Ser 1.43 (*)    All other components within normal limits  CBC WITH DIFFERENTIAL/PLATELET  TROPONIN I    RADIOLOGY  CT angiogram of the chest is pending  _ EKG: Interpreted by me, normal sinus rhythm with a rate of 80 bpm, left anterior fascicular block, anterior T-wave inversions. No signs of acute infarction  ___________________________________________  FINAL ASSESSMENT AND PLAN  Cyanosis  Plan: Patient with labs and imaging as dictated above. Final disposition is pending at this time.   Earleen Newport, MD   Earleen Newport, MD 03/03/15 [place], MD 03/03/15 [phone]

## 2015-03-03 NOTE — ED Notes (Signed)
Mother of pt believes that 'something' is wrong with her child, has seen PMD, PMD at a lossfor what is wrong was told to come to ED for eval.

## 2015-03-03 NOTE — ED Notes (Signed)
Pt has MR.  Mom states his skin looks like he is not getting enough oxygen.  Pt a&o, skin w/d with good color at this time.

## 2015-03-03 NOTE — ED Provider Notes (Signed)
[facility]  I accepted care from Dr. Jimmye Norman ____________________________________________     [place] were viewed by me. Imaging interpreted by radiologist.  CT chest with contrast for MD:[id number]: 1. No thoracic aortic aneurysm or dissection. 2. No pulmonary embolus.  ____________________________________________   PROCEDURES  Procedure(s) performed: None  Critical Care performed: None  ____________________________________________   INITIAL IMPRESSION / ASSESSMENT AND PLAN / ED COURSE  CONSULTATIONS: None  Pertinent labs & imaging results that were available during my care of the patient were reviewed by me and considered in my medical decision making (see chart for details).  I accepted care from Dr. Jimmye Norman. Per report the history is that the hands and arms intermittently looked purple to mom and doesn't seem to be painful or causing distress. There is not exactly a rash associated with. Symptoms have been on and off for about a week and might last about 30 minutes and then go away. Patient has recently had appendectomy and hernia repair within the last few weeks. Since then he has been waking up early at home.  Nurse called me and because the mom noticed his back was read here in the emergency department. Indeed his back was a little bit red, not exactly sure if this is a rash, or if it's just heat related from him lying on the bed in the sheets. There is definitely no petechiae. There are no hives. It's easily blanching.  Dr. Jimmye Norman plan included laboratory evaluations which are reassuring, and a CT of the chest to rule out PE.  The CT is negative for PE. When I discussed with mom the history, it sounds like maybe the symptoms are due to Raynaud's phenomenon. In any case, I have asked her to follow up with her child's primary care physician.  Patient / Family / Caregiver informed of clinical course, medical decision-making process,  and agree with plan.   I discussed return precautions, follow-up instructions, and discharged instructions with patient and/or family.  ____________________________________________   FINAL CLINICAL IMPRESSION(S) / ED DIAGNOSES  Final diagnoses:  Cyanosis  Raynauds phenomenon     FOLLOW UP   Referred to: Dr. Benita Stabile, PCP, one week   Lisa Roca, MD 03/03/15 1740

## 2015-03-03 NOTE — ED Notes (Signed)
Patient transported to CT 

## 2015-03-03 NOTE — ED Notes (Signed)
Assumed pt care at this time. Pt's mother at bedside. Per pt's mother, pt has rash on back and neck that she just noticed. NAD noted. RR even and nonlabored. MD informed of this and at bedside for re eval.

## 2015-03-03 NOTE — Discharge Instructions (Signed)
No certain cause was found for her at the skin changes your describing, however your son's emergency department evaluation and exam are reassuring. As we discussed, I suspect this might be a form of Raynaud's Phenomenon. Return to the emergency department for any new or condition including skin rash, fever, altered mental status, or any other symptoms concerning to you.   Raynaud's Syndrome Raynaud's Syndrome is a disorder of the blood vessels in your hands and feet. It occurs when small arteries of the arms/hands or legs/feet become sensitive to cold or emotional upset. This causes the arteries to constrict, or narrow, and reduces blood flow to the area. The color in the fingers or toes changes from white to bluish to red and this is not usually painful. There may be numbness and tingling. Sores on the skin (ulcers) can form. Symptoms are usually relieved by warming. HOME CARE INSTRUCTIONS   Avoid exposure to cold. Keep your whole body warm and dry. Dress in layers. Wear mittens or gloves when handling ice or frozen food and when outdoors. Use holders for glasses or cans containing cold drinks. If possible, stay indoors during cold weather.  Limit your use of caffeine. Switch to decaffeinated coffee, tea, and soda pop. Avoid chocolate.  Avoid smoking or being around cigarette smoke. Smoke will make symptoms worse.  Wear loose fitting socks and comfortable, roomy shoes.  Avoid vibrating tools and machinery.  If possible, avoid stressful and emotional situations. Exercise, meditation and yoga may help you cope with stress. Biofeedback may be useful.  Ask your caregiver about medicine (calcium channel blockers) that may control Raynaud's phenomena. SEEK MEDICAL CARE IF:   Your discomfort becomes worse, despite conservative treatment.  You develop sores on your fingers and toes that do not heal. Document Released: 06/30/2000 Document Revised: 09/25/2011 Document Reviewed: 07/07/2008 [facility]  Patient Information 2015 [place], [place]. This information is not intended to replace advice given to you by your health care provider. Make sure you discuss any questions you have with your health care provider.

## 2015-03-03 NOTE — ED Notes (Signed)
Pt's mother at bedside requesting update. ER provider informed and will be in to re assess pt soon. Pt and pt's mother informed of this as well.

## 2016-06-12 ENCOUNTER — Other Ambulatory Visit: Payer: Self-pay | Admitting: Neurology

## 2016-06-12 DIAGNOSIS — Z87898 Personal history of other specified conditions: Secondary | ICD-10-CM

## 2016-06-12 DIAGNOSIS — S0990XA Unspecified injury of head, initial encounter: Secondary | ICD-10-CM

## 2016-06-12 DIAGNOSIS — Z7289 Other problems related to lifestyle: Secondary | ICD-10-CM

## 2016-06-13 ENCOUNTER — Other Ambulatory Visit: Payer: Self-pay

## 2016-06-13 ENCOUNTER — Ambulatory Visit (INDEPENDENT_AMBULATORY_CARE_PROVIDER_SITE_OTHER): Payer: Medicare Other | Admitting: Surgery

## 2016-06-13 ENCOUNTER — Encounter: Payer: Self-pay | Admitting: Surgery

## 2016-06-13 VITALS — BP 129/85 | HR 83 | Temp 98.3°F | Wt 180.0 lb

## 2016-06-13 DIAGNOSIS — K429 Umbilical hernia without obstruction or gangrene: Secondary | ICD-10-CM | POA: Diagnosis not present

## 2016-06-13 DIAGNOSIS — K219 Gastro-esophageal reflux disease without esophagitis: Secondary | ICD-10-CM | POA: Diagnosis not present

## 2016-06-13 MED ORDER — DEXLANSOPRAZOLE 60 MG PO CPDR: 60.0000 mg | DELAYED_RELEASE_CAPSULE | Freq: Every day | ORAL | 3 refills | Status: AC

## 2016-06-13 MED ORDER — POLYETHYLENE GLYCOL 3350 17 G PO PACK: 17.0000 g | PACK | Freq: Every day | ORAL | 3 refills | Status: DC

## 2016-06-13 NOTE — Progress Notes (Signed)
Subjective:     Patient ID: Timothy Rose, male   DOB: Sep 02, 1985, 30 y.o.   MRN: [id number]  HPI  30 year old male with a history of autism and some mental delay was seen by Dr. Felton Clinton back in August 2016 for a laparoscopic appendectomy for acute appendicitis and also did a primary repair of umbilical hernia that was nonreducible at the time. The patient returns with his mother is complaining of one month of abdominal pain. The mother states that he will have this pain and it is in the top of his abdomen and the patient complains of pain up into his chest and a burning sensation. The patient does complain of reflux and a foul taste in his mouth and the feeling of vomitus there occasionally. The patient has had nausea as well but hasn't had any actual vomiting. The patient states that he has some burning sensation in about taste in the morning and after laying down as well. The patient also has some chronic constipation where his been on MiraLAX continuously. The mother sometimes is unable to pay for this and has requested prescription to help a for the MiraLAX. She has not used any other medications. I did discuss with the patient the amount of water that he drinks which is minimal during the day and drinking mainly green tea. The patient is agreeable to attempt to drink more water during the day as well.  Past Medical History:  Diagnosis Date  . Autism    Past Surgical History:  Procedure Laterality Date  . APPENDECTOMY    . cranial surgery    . INGUINAL HERNIA REPAIR Left   . LAPAROSCOPIC APPENDECTOMY N/A 02/18/2015   Procedure: Laparoscopic appendectomy, umbilical hernia repair;  Surgeon: Sherri Rad, MD;  Location: [facility] ORS;  Service: General;  Laterality: N/A;  . pyloric stenosis    . SURGERY SCROTAL / TESTICULAR    . TYMPANOSTOMY TUBE PLACEMENT     Family History  Problem Relation Age of Onset  . Diabetes Mother    Social History   Social History  . Marital status: Single    Spouse  name: N/A  . Number of children: N/A  . Years of education: N/A   Social History Main Topics  . Smoking status: Never Smoker  . Smokeless tobacco: Never Used  . Alcohol use No  . Drug use: No  . Sexual activity: Not Asked   Other Topics Concern  . None   Social History Narrative  . None    Current Outpatient Prescriptions:  .  azelastine (ASTELIN) 0.1 % nasal spray, , Disp: , Rfl:  .  diazepam (VALIUM) 10 MG tablet, Take 10 mg by mouth 3 (three) times daily., Disp: , Rfl:  .  fluticasone (FLONASE) 50 MCG/ACT nasal spray, , Disp: , Rfl:  .  Melatonin 10 MG TABS, Take 10 mg by mouth at bedtime as needed., Disp: , Rfl:  .  ofloxacin (OCUFLOX) 0.3 % ophthalmic solution, , Disp: , Rfl:  .  Paliperidone (INVEGA) 1.5 MG TB24, Take 1.5 mg by mouth at bedtime., Disp: , Rfl:  .  polyethylene glycol (MIRALAX / GLYCOLAX) packet, Take 17 g by mouth daily as needed., Disp: , Rfl:  .  QUEtiapine (SEROQUEL) 25 MG tablet, , Disp: , Rfl:  .  dexlansoprazole (DEXILANT) 60 MG capsule, Take 1 capsule (60 mg total) by mouth daily., Disp: 30 capsule, Rfl: 3 .  PARoxetine (PAXIL-CR) 12.5 MG 24 hr tablet, Take 12.5 mg by mouth daily.,  Disp: , Rfl:  .  polyethylene glycol (MIRALAX) packet, Take 17 g by mouth daily., Disp: 30 each, Rfl: 3 Allergies  Allergen Reactions  . Haldol [Haloperidol] Swelling  . Sulfa Antibiotics Hives  . Viibryd [employer] Hcl] Other (See Comments)    Gets aggressive     Review of Systems  Constitutional: Negative for activity change, appetite change, chills, fatigue and fever.  HENT: Negative for congestion and sore throat.   Respiratory: Negative for cough, shortness of breath and stridor.   Cardiovascular: Negative for chest pain, palpitations and leg swelling.  Gastrointestinal: Positive for abdominal pain, constipation and nausea. Negative for diarrhea and vomiting.  Genitourinary: Negative for dysuria and hematuria.  Musculoskeletal: Negative for back pain and  joint swelling.  Skin: Negative for color change, pallor and rash.  Neurological: Negative for dizziness and weakness.  Hematological: Negative for adenopathy. Does not bruise/bleed easily.  Psychiatric/Behavioral: Negative for agitation. The patient is not nervous/anxious.   All other systems reviewed and are negative.      Vitals:   06/13/16 1440  BP: 129/85  Pulse: 83  Temp: 98.3 F (36.8 C)    Objective:   Physical Exam  Constitutional: He is oriented to person, place, and time. He appears well-developed and well-nourished. No distress.  HENT:  Head: Atraumatic.  Right Ear: External ear normal.  Left Ear: External ear normal.  Nose: Nose normal.  Mouth/Throat: Oropharynx is clear and moist. No oropharyngeal exudate.  Eyes: Conjunctivae and EOM are normal. Pupils are equal, round, and reactive to light. No scleral icterus.  Neck: Normal range of motion. Neck supple. No tracheal deviation present.  Cardiovascular: Normal rate, regular rhythm, normal heart sounds and intact distal pulses.  Exam reveals no gallop and no friction rub.   No murmur heard. Pulmonary/Chest: Effort normal and breath sounds normal. No respiratory distress. He has no wheezes. He has no rales.  Abdominal: Soft. Bowel sounds are normal. He exhibits no distension. There is tenderness. There is no rebound and no guarding.  Epigastric and left upper quadrant tenderness, well healed incision sites from previous appendectomy, 5 mm easily reducible umbilical hernia with minimal pain  Musculoskeletal: Normal range of motion. He exhibits no edema, tenderness or deformity.  Neurological: He is alert and oriented to person, place, and time. No cranial nerve deficit.  Skin: Skin is warm and dry. No rash noted. No erythema. No pallor.  Psychiatric: He has a normal mood and affect. His behavior is normal. Judgment and thought content normal.  Vitals reviewed.      Assessment:     30 yr old male with complaint of  GERD and small umbilical hernia     Plan:     I have personally reviewed this patient's past medical history which is significant for his autism and mental delay as well as many psychiatric issues that have recurred from this and frequent visits to the hospital on the psychiatrist. Rene Paci also personally reviewed his records from 2016 when he had his umbilical hernia repair and laparoscopic appendectomy. He did have a large nonreducible umbilical hernia back in 2016 which was repaired primarily by Dr. Felton Clinton. He does have a very small less than 5 mm umbilical hernia at this time which is nonincarcerated and has minimal pain.  I do not believe that this is the source of his primary abdominal pain and I do not believe that needs to be repaired at this time.  He has expressed symptoms consistent with gastroesophageal reflux disease although  he has not been diagnosed with this previously. He does state that last at in his mouth and heartburn in many of the symptoms involved in either GERD or gastritis. He also has issues with chronic constipation which can make issues of reflux worse and will give a prescription for the MiraLAX a day to help the mother get it for him. I am going to also start him on a proton pump inhibitor as well. I am going to get him seen by one of our gastroenterologist to help manage both his GERD and see if he needs an EGD and to help manage his chronic constipation. I did discuss with the mother that given some of the newer medications such as Linzess and Amitiza that these may help him with his chronic constipation in the future but that the gastroenterologist would be able to give more information on these medications.

## 2016-06-13 NOTE — Patient Instructions (Signed)
We will see you at 3:30 PM at our [facility] location.

## 2016-06-14 ENCOUNTER — Other Ambulatory Visit: Payer: Self-pay

## 2016-06-14 ENCOUNTER — Encounter: Payer: Self-pay | Admitting: Gastroenterology

## 2016-06-14 ENCOUNTER — Ambulatory Visit (INDEPENDENT_AMBULATORY_CARE_PROVIDER_SITE_OTHER): Payer: Medicare Other | Admitting: Gastroenterology

## 2016-06-14 VITALS — BP 123/74 | HR 94 | Temp 98.1°F | Ht 70.0 in | Wt 178.2 lb

## 2016-06-14 DIAGNOSIS — K5900 Constipation, unspecified: Secondary | ICD-10-CM

## 2016-06-14 DIAGNOSIS — K581 Irritable bowel syndrome with constipation: Secondary | ICD-10-CM

## 2016-06-14 DIAGNOSIS — K625 Hemorrhage of anus and rectum: Secondary | ICD-10-CM

## 2016-06-14 DIAGNOSIS — Z8371 Family history of colonic polyps: Secondary | ICD-10-CM | POA: Diagnosis not present

## 2016-06-14 DIAGNOSIS — K219 Gastro-esophageal reflux disease without esophagitis: Secondary | ICD-10-CM | POA: Diagnosis not present

## 2016-06-14 MED ORDER — PEG 3350-KCL-NABCB-NACL-NASULF 236 G PO SOLR: ORAL | 0 refills | Status: DC

## 2016-06-14 NOTE — Progress Notes (Signed)
Gastroenterology Consultation  Referring Provider:     Albina Billet, MD Primary Care Physician:  Albina Billet, MD Primary Gastroenterologist:  Dr. Jonathon Bellows  Reason for Consultation:     GERD, EGD,constipation         HPI:   Timothy Rose is a 30 y.o. y/o male referred for consultation & management  by Dr. Albina Billet, MD.    He has been referred by Dr Ronalee Belts who saw him yesterday to help with managing constipation and GERD . He was seen for abdominal pain of 1 month duration . He has a history of GERD.  He suffers from autism here with his mother  Constipation: Onset since birth, requires enemas very often ,  Frequency of bowel movements can go upto 5 days without a bowel movement . His mother gives him some miralax if he does not have a bowel movement in 2 days.  Consistency very hard " mother says she has to chop it out at times", consistency harder than a raw banana These features have ben chronic with no new changes Pain No  Bloating/abdominal distension gassy at times  Bleeding when he has a very large bowel movement  Last colonoscopy : no  Weight loss n0  Family history of colon cancer no , mother has had polyps at age 96 or 84's , she had a posible cancerous polyp at some time  Diet :rich in fruit and vegetables  Narcotic/anticholinergic medications none  Laxative use miralax works well , at times has a bowel movement in his "pants" , given PRN by mother.  Thyroid abnormalities none she is aware of     Reflux:  Onset : began a week back when he was burping a lot and would say "ouch", felt like he was throwing up but nothing came up , he felt a sour taste in his mouth . Started dexilant, seems to have helped. Recent weight gain: no  Prior EGD: no  Family history of esophageal cancer:no  Gassy at times.  Per mother when he eats, consumes a lot of food , eats very fast , stuffs his mouth.    He has been complaining of abdominal pain for a few months,all day long,  central , ongoing even prior to his surgery for appendix/. Better after a bowel movement .   Past Medical History:  Diagnosis Date  . Autism     Past Surgical History:  Procedure Laterality Date  . APPENDECTOMY    . cranial surgery    . INGUINAL HERNIA REPAIR Left   . LAPAROSCOPIC APPENDECTOMY N/A 02/18/2015   Procedure: Laparoscopic appendectomy, umbilical hernia repair;  Surgeon: Sherri Rad, MD;  Location: [facility] ORS;  Service: General;  Laterality: N/A;  . pyloric stenosis    . SURGERY SCROTAL / TESTICULAR    . TYMPANOSTOMY TUBE PLACEMENT      Prior to Admission medications   Medication Sig Start Date End Date Taking? Authorizing Provider  azelastine (ASTELIN) 0.1 % nasal spray  05/25/16  Yes Historical Provider, MD  dexlansoprazole (DEXILANT) 60 MG capsule Take 1 capsule (60 mg total) by mouth daily. 06/13/16  Yes Hubbard Robinson, MD  diazepam (VALIUM) 10 MG tablet Take 10 mg by mouth 3 (three) times daily.   Yes Historical Provider, MD  fluticasone Asencion Islam) 50 MCG/ACT nasal spray  05/25/16  Yes Historical Provider, MD  Melatonin 10 MG TABS Take 10 mg by mouth at bedtime as needed.   Yes Historical Provider, MD  polyethylene glycol (MIRALAX / GLYCOLAX) packet Take 17 g by mouth daily as needed.   Yes Historical Provider, MD  polyethylene glycol (MIRALAX) packet Take 17 g by mouth daily. 06/13/16  Yes Hubbard Robinson, MD  QUEtiapine (SEROQUEL) 25 MG tablet  06/05/16  Yes Historical Provider, MD  ofloxacin (OCUFLOX) 0.3 % ophthalmic solution  05/25/16   Historical Provider, MD  Paliperidone (INVEGA) 1.5 MG TB24 Take 1.5 mg by mouth at bedtime.    Historical Provider, MD  PARoxetine (PAXIL-CR) 12.5 MG 24 hr tablet Take 12.5 mg by mouth daily. 02/15/15 02/15/16  Historical Provider, MD    Family History  Problem Relation Age of Onset  . Diabetes Mother      Social History  Substance Use Topics  . Smoking status: Never Smoker  . Smokeless tobacco: Never Used  . Alcohol use No     Allergies as of 06/14/2016 - Review Complete 06/14/2016  Allergen Reaction Noted  . Haldol [haloperidol] Swelling 02/18/2015  . Sulfa antibiotics Hives 02/17/2015  . Viibryd [vilazodone hcl] Other (See Comments) 02/18/2015    Review of Systems:    All systems reviewed and negative except where noted in HPI.   Physical Exam:  BP 123/74   Pulse 94   Temp 98.1 F (36.7 C) (Oral)   Ht 5' 10"  (1.778 m)   Wt 178 lb 3.2 oz (80.8 kg)   BMI 25.57 kg/m  No LMP for male patient. Psych:  Alert and cooperative. Normal mood and affect. General:   Alert,  W well-nourished, pleasant and cooperative in NAD Head:  Normocephalic and atraumatic. Eyes:  Sclera clear, no icterus.   Conjunctiva pink. Ears:  Normal auditory acuity. Nose:  No deformity, discharge, or lesions. Mouth:  No deformity or lesions,oropharynx pink & moist. Neck:  Supple; no masses or thyromegaly. Lungs:  Respirations even and unlabored.  Clear throughout to auscultation.   No wheezes, crackles, or rhonchi. No acute distress. Heart:  Regular rate and rhythm; no murmurs, clicks, rubs, or gallops. Abdomen: RUQ horizontal scar, Normal bowel sounds.  No bruits.  Soft, mild epigastric tenderness and non-distended without masses, hepatosplenomegaly or hernias noted.  No guarding or rebound tenderness.    Psych:  Alert and cooperative.   Imaging Studies: No results found.  Assessment and Plan:   Timothy Rose is a 30 y.o. y/o male has been referred for GERD,constipation ,abdominal pain.  His history is suggestive of irritable bowel syndrome with constipation which seems to be an issue since childhood. He also has a family history of colon polyps with his mother having polyps in her 85's. He also has chronic abdominal pain which is non specific. He is on a PPI just started   1. EGD+ colonoscopy in hospital prep if possible -mother says she would be unable to help with the bowel prep at home.  2.  Counseled on life style  changes,advised on the use of a wedge pillow at night , avoid meals for 2 hours prior to bed time. Weight loss.Discussed the risks and benefits of long term PPI use including but not limited to bone loss, chronic kidney disease, infections , low magnesium . Aim to use at the lowest dose for the shortest period of time  3. Discussed newer agents for constipation such as linzess and amitiza.Mother not keen, feels she can titrate miralax to his needs better than a fixed dose tablet.  Follow up in 6 weeks   Dr Jonathon Bellows MD

## 2016-06-14 NOTE — Patient Instructions (Signed)
Gastroesophageal Reflux Scan A gastroesophageal reflux scan is a procedure that is used to check for gastroesophageal reflux, which is the backward flow of stomach contents into the tube that carries food from the mouth to the stomach (esophagus). The scan can also show if any stomach contents are inhaled (aspirated) into your lungs. You may need this scan if you have symptoms such as heartburn, vomiting, swallowing problems, or regurgitation. Regurgitation means that swallowed food is returning from the stomach to the esophagus. For this scan, you will drink a liquid that contains a small amount of a radioactive substance (tracer). A scanner with a camera that detects the radioactive tracer is used to see if any of the material backs up into your esophagus. Tell a health care provider about:  Any allergies you have.  All medicines you are taking, including vitamins, herbs, eye drops, creams, and over-the-counter medicines.  Any blood disorders you have.  Any surgeries you have had.  Any medical conditions you have.  If you are pregnant or you think that you may be pregnant.  If you are breastfeeding. What are the risks? Generally, this is a safe procedure. However, problems may occur, including:  Exposure to radiation (a small amount).  Allergic reaction to the radioactive substance. This is rare. What happens before the procedure?  Ask your health care provider about changing or stopping your regular medicines. This is especially important if you are taking diabetes medicines or blood thinners.  Follow your health care provider's instructions about eating or drinking restrictions. What happens during the procedure?  You will be asked to drink a liquid that contains a small amount of a radioactive tracer. This liquid will probably be similar to orange juice.  You will assume a position lying on your back.  A series of images will be taken of your esophagus and upper  stomach.  You may be asked to move into different positions to help determine if reflux occurs more often when you are in specific positions.  For adults, an abdominal binder with an inflatable cuff may be placed on the belly (abdomen). This may be used to increase abdominal pressure. More images will be taken to see if the increased pressure causes reflux to occur. The procedure may vary among health care providers and hospitals. What happens after the procedure?  Return to your normal activities and your normal diet as directed by your health care provider.  The radioactive tracer will leave your body over the next few days. Drink enough fluid to keep your urine clear or pale yellow. This will help to flush the tracer out of your body.  It is your responsibility to obtain your test results. Ask your health care provider or the department performing the test when and how you will get your results. This information is not intended to replace advice given to you by your health care provider. Make sure you discuss any questions you have with your health care provider. Document Released: 08/24/2005 Document Revised: 03/27/2016 Document Reviewed: 04/14/2014 Elsevier Interactive Patient Education  2017 [employer].  High-Fiber Diet Fiber, also called dietary fiber, is a type of carbohydrate found in fruits, vegetables, whole grains, and beans. A high-fiber diet can have many health benefits. Your health care provider may recommend a high-fiber diet to help:  Prevent constipation. Fiber can make your bowel movements more regular.  Lower your cholesterol.  Relieve hemorrhoids, uncomplicated diverticulosis, or irritable bowel syndrome.  Prevent overeating as part of a weight-loss plan.  Prevent heart disease, type 2 diabetes, and certain cancers. What is my plan? The recommended daily intake of fiber includes:  38 grams for men under age 28.  80 grams for men over age 82.  43 grams for  women under age 17.  77 grams for women over age 74. You can get the recommended daily intake of dietary fiber by eating a variety of fruits, vegetables, grains, and beans. Your health care provider may also recommend a fiber supplement if it is not possible to get enough fiber through your diet. What do I need to know about a high-fiber diet?  Fiber supplements have not been widely studied for their effectiveness, so it is better to get fiber through food sources.  Always check the fiber content on thenutrition facts label of any prepackaged food. Look for foods that contain at least 5 grams of fiber per serving.  Ask your dietitian if you have questions about specific foods that are related to your condition, especially if those foods are not listed in the following section.  Increase your daily fiber consumption gradually. Increasing your intake of dietary fiber too quickly may cause bloating, cramping, or gas.  Drink plenty of water. Water helps you to digest fiber. What foods can I eat? Grains  Whole-grain breads. Multigrain cereal. Oats and oatmeal. Brown rice. Barley. Bulgur wheat. [place]. Bran muffins. Popcorn. Rye wafer crackers. Vegetables  Sweet potatoes. Spinach. Kale. Artichokes. Cabbage. Broccoli. Green peas. Carrots. Squash. Fruits  Berries. Pears. Apples. Oranges. Avocados. Prunes and raisins. Dried figs. Meats and Other Protein Sources  Navy, kidney, pinto, and soy beans. Split peas. Lentils. Nuts and seeds. Dairy  Fiber-fortified yogurt. Beverages  Fiber-fortified soy milk. Fiber-fortified orange juice. Other  Fiber bars. The items listed above may not be a complete list of recommended foods or beverages. Contact your dietitian for more options.  What foods are not recommended? Grains  White bread. Pasta made with refined flour. White rice. Vegetables  Fried potatoes. Canned vegetables. Well-cooked vegetables. Fruits  Fruit juice. Cooked, strained fruit. Meats  and Other Protein Sources  Fatty cuts of meat. Fried Sales executive or fried fish. Dairy  Milk. Yogurt. Cream cheese. Sour cream. Beverages  Soft drinks. Other  Cakes and pastries. Butter and oils. The items listed above may not be a complete list of foods and beverages to avoid. Contact your dietitian for more information.  What are some tips for including high-fiber foods in my diet?  Eat a wide variety of high-fiber foods.  Make sure that half of all grains consumed each day are whole grains.  Replace breads and cereals made from refined flour or white flour with whole-grain breads and cereals.  Replace white rice with brown rice, bulgur wheat, or millet.  Start the day with a breakfast that is high in fiber, such as a cereal that contains at least 5 grams of fiber per serving.  Use beans in place of meat in soups, salads, or pasta.  Eat high-fiber snacks, such as berries, raw vegetables, nuts, or popcorn. This information is not intended to replace advice given to you by your health care provider. Make sure you discuss any questions you have with your health care provider. Document Released: 07/03/2005 Document Revised: 12/09/2015 Document Reviewed: 12/16/2013 Elsevier Interactive Patient Education  2017 [employer].

## 2016-06-21 ENCOUNTER — Ambulatory Visit
Admission: RE | Admit: 2016-06-21 | Discharge: 2016-06-21 | Disposition: A | Discharge status: Home / Self Care | Payer: Medicare Other | Source: Ambulatory Visit | Attending: Neurology | Admitting: Neurology

## 2016-06-21 DIAGNOSIS — F489 Nonpsychotic mental disorder, unspecified: Secondary | ICD-10-CM | POA: Diagnosis not present

## 2016-06-21 DIAGNOSIS — Z7289 Other problems related to lifestyle: Secondary | ICD-10-CM

## 2016-06-21 DIAGNOSIS — S0990XA Unspecified injury of head, initial encounter: Secondary | ICD-10-CM | POA: Diagnosis present

## 2016-06-21 DIAGNOSIS — Z87898 Personal history of other specified conditions: Secondary | ICD-10-CM | POA: Diagnosis not present

## 2016-06-26 ENCOUNTER — Telehealth: Payer: Self-pay | Admitting: Gastroenterology

## 2016-06-26 NOTE — Telephone Encounter (Signed)
Patient needs to reschedule his colonoscopy until January per mother

## 2016-06-26 NOTE — Telephone Encounter (Signed)
Pt scheduled for a colonoscopy and EGD at [facility] with Dr. Vicente Males on Thursday, Jan 75IE,   Please precert for  Constipation K59.00 Family hx of colon polyp Z83.71 IBS with constipation K58.1 Rectal bleeding K62.5 GERD K21.9

## 2016-06-27 NOTE — Telephone Encounter (Signed)
Patient has medicare & medicaid. Pre cert is not required

## 2016-08-02 ENCOUNTER — Encounter: Payer: Self-pay | Admitting: *Deleted

## 2016-08-02 ENCOUNTER — Other Ambulatory Visit: Payer: Self-pay

## 2016-08-02 MED ORDER — PEG 3350-KCL-NABCB-NACL-NASULF 236 G PO SOLR: ORAL | 0 refills | Status: DC

## 2016-08-10 ENCOUNTER — Ambulatory Visit
Admission: RE | Admit: 2016-08-10 | Discharge: 2016-08-10 | Disposition: A | Discharge status: Home / Self Care | Payer: Medicare Other | Source: Ambulatory Visit | Attending: Gastroenterology | Admitting: Gastroenterology

## 2016-08-10 ENCOUNTER — Ambulatory Visit: Payer: Medicare Other | Admitting: Anesthesiology

## 2016-08-10 ENCOUNTER — Encounter: Payer: Self-pay | Admitting: Anesthesiology

## 2016-08-10 ENCOUNTER — Encounter
Admission: RE | Disposition: A | Discharge status: Home / Self Care | Payer: Self-pay | Source: Ambulatory Visit | Attending: Gastroenterology

## 2016-08-10 DIAGNOSIS — F209 Schizophrenia, unspecified: Secondary | ICD-10-CM | POA: Diagnosis not present

## 2016-08-10 DIAGNOSIS — F419 Anxiety disorder, unspecified: Secondary | ICD-10-CM | POA: Diagnosis not present

## 2016-08-10 DIAGNOSIS — Z79899 Other long term (current) drug therapy: Secondary | ICD-10-CM | POA: Diagnosis not present

## 2016-08-10 DIAGNOSIS — Z833 Family history of diabetes mellitus: Secondary | ICD-10-CM | POA: Diagnosis not present

## 2016-08-10 DIAGNOSIS — K317 Polyp of stomach and duodenum: Secondary | ICD-10-CM | POA: Diagnosis not present

## 2016-08-10 DIAGNOSIS — Z888 Allergy status to other drugs, medicaments and biological substances status: Secondary | ICD-10-CM | POA: Diagnosis not present

## 2016-08-10 DIAGNOSIS — Z882 Allergy status to sulfonamides status: Secondary | ICD-10-CM | POA: Diagnosis not present

## 2016-08-10 DIAGNOSIS — K2970 Gastritis, unspecified, without bleeding: Secondary | ICD-10-CM

## 2016-08-10 DIAGNOSIS — R109 Unspecified abdominal pain: Secondary | ICD-10-CM | POA: Diagnosis present

## 2016-08-10 DIAGNOSIS — K2950 Unspecified chronic gastritis without bleeding: Secondary | ICD-10-CM | POA: Diagnosis not present

## 2016-08-10 DIAGNOSIS — R569 Unspecified convulsions: Secondary | ICD-10-CM | POA: Insufficient documentation

## 2016-08-10 DIAGNOSIS — K219 Gastro-esophageal reflux disease without esophagitis: Secondary | ICD-10-CM | POA: Insufficient documentation

## 2016-08-10 DIAGNOSIS — F840 Autistic disorder: Secondary | ICD-10-CM | POA: Diagnosis not present

## 2016-08-10 HISTORY — PX: ESOPHAGOGASTRODUODENOSCOPY (EGD) WITH PROPOFOL: SHX5813

## 2016-08-10 HISTORY — DX: Anxiety disorder, unspecified: F41.9

## 2016-08-10 HISTORY — DX: Unspecified convulsions: R56.9

## 2016-08-10 HISTORY — DX: Schizophrenia, unspecified: F20.9

## 2016-08-10 SURGERY — ESOPHAGOGASTRODUODENOSCOPY (EGD) WITH PROPOFOL
Anesthesia: General

## 2016-08-10 MED ORDER — SODIUM CHLORIDE 0.9 % IV SOLN
INTRAVENOUS | Status: DC
  Administered 2016-08-10: 10:00:00 via INTRAVENOUS

## 2016-08-10 MED ORDER — FENTANYL CITRATE (PF) 100 MCG/2ML IJ SOLN
INTRAMUSCULAR | Status: DC | PRN
  Administered 2016-08-10: 50 ug via INTRAVENOUS

## 2016-08-10 MED ORDER — PROPOFOL 500 MG/50ML IV EMUL
INTRAVENOUS | Status: AC
  Filled 2016-08-10: qty 50

## 2016-08-10 MED ORDER — PROPOFOL 500 MG/50ML IV EMUL
INTRAVENOUS | Status: DC | PRN
  Administered 2016-08-10: 120 ug/kg/min via INTRAVENOUS

## 2016-08-10 MED ORDER — FENTANYL CITRATE (PF) 100 MCG/2ML IJ SOLN
INTRAMUSCULAR | Status: AC
  Filled 2016-08-10: qty 2

## 2016-08-10 MED ORDER — MIDAZOLAM HCL 5 MG/5ML IJ SOLN
INTRAMUSCULAR | Status: DC | PRN
  Administered 2016-08-10: 1 mg via INTRAVENOUS

## 2016-08-10 MED ORDER — MIDAZOLAM HCL 2 MG/2ML IJ SOLN
INTRAMUSCULAR | Status: AC
  Filled 2016-08-10: qty 2

## 2016-08-10 NOTE — H&P (Signed)
Jonathon Bellows MD [address] [place],  [address] Phone: [phone] Fax : [phone]  Primary Care Physician:  Albina Billet, MD Primary Gastroenterologist:  Dr. Jonathon Bellows   Pre-Procedure History & Physical: HPI:  Timothy Rose is a 31 y.o. male is here for an endoscopy.   Past Medical History:  Diagnosis Date  . Anxiety   . Autism   . Schizophrenia ([place])   . Seizures ([place])     Past Surgical History:  Procedure Laterality Date  . APPENDECTOMY    . cranial surgery    . INGUINAL HERNIA REPAIR Left   . LAPAROSCOPIC APPENDECTOMY N/A 02/18/2015   Procedure: Laparoscopic appendectomy, umbilical hernia repair;  Surgeon: Sherri Rad, MD;  Location: [facility] ORS;  Service: General;  Laterality: N/A;  . pyloric stenosis    . SURGERY SCROTAL / TESTICULAR    . TYMPANOSTOMY TUBE PLACEMENT      Prior to Admission medications   Medication Sig Start Date End Date Taking? Authorizing Provider  azelastine (ASTELIN) 0.1 % nasal spray  05/25/16  Yes Historical Provider, MD  diazepam (VALIUM) 10 MG tablet Take 10 mg by mouth 3 (three) times daily.   Yes Historical Provider, MD  divalproex (DEPAKOTE ER) 250 MG 24 hr tablet Take 250 mg by mouth daily.   Yes Historical Provider, MD  fluticasone Asencion Islam) 50 MCG/ACT nasal spray  05/25/16  Yes Historical Provider, MD  Melatonin 10 MG TABS Take 10 mg by mouth at bedtime as needed.   Yes Historical Provider, MD  naproxen sodium (ANAPROX) 220 MG tablet Take 110 mg by mouth 2 (two) times daily with a meal.   Yes Historical Provider, MD  polyethylene glycol (GOLYTELY) 236 g solution Drink one 8oz glass every 20 mins until stools are clear 08/02/16  Yes Jonathon Bellows, MD  polyethylene glycol [facility]) packet Take 17 g by mouth daily. 06/13/16  Yes Hubbard Robinson, MD  dexlansoprazole (DEXILANT) 60 MG capsule Take 1 capsule (60 mg total) by mouth daily. 06/13/16   Hubbard Robinson, MD  ofloxacin (OCUFLOX) 0.3 % ophthalmic solution  05/25/16    Historical Provider, MD  Paliperidone (INVEGA) 1.5 MG TB24 Take 1.5 mg by mouth at bedtime.    Historical Provider, MD  PARoxetine (PAXIL-CR) 12.5 MG 24 hr tablet Take 12.5 mg by mouth daily. 02/15/15 02/15/16  Historical Provider, MD  polyethylene glycol (MIRALAX / GLYCOLAX) packet Take 17 g by mouth daily as needed.    Historical Provider, MD  QUEtiapine (SEROQUEL) 25 MG tablet  06/05/16   Historical Provider, MD    Allergies as of 06/14/2016 - Review Complete 06/14/2016  Allergen Reaction Noted  . Haldol [haloperidol] Swelling 02/18/2015  . Sulfa antibiotics Hives 02/17/2015  . Viibryd [vilazodone hcl] Other (See Comments) 02/18/2015    Family History  Problem Relation Age of Onset  . Diabetes Mother     Social History   Social History  . Marital status: Single    Spouse name: N/A  . Number of children: N/A  . Years of education: N/A   Occupational History  . Not on file.   Social History Main Topics  . Smoking status: Never Smoker  . Smokeless tobacco: Never Used  . Alcohol use No  . Drug use: No  . Sexual activity: Not on file   Other Topics Concern  . Not on file   Social History Narrative  . No narrative on file    Review of Systems: See HPI, otherwise negative ROS  Physical Exam: BP 120/77   Pulse 76   Temp 98.4 F (36.9 C) (Tympanic)   Resp (!) 22   Ht 5' 7"  (1.702 m)   Wt 178 lb (80.7 kg)   SpO2 100%   BMI 27.88 kg/m  General:   Alert,  pleasant and cooperative in NAD Head:  Normocephalic and atraumatic. Neck:  Supple; no masses or thyromegaly. Lungs:  Clear throughout to auscultation.    Heart:  Regular rate and rhythm. Abdomen:  Soft, nontender and nondistended. Normal bowel sounds, without guarding, and without rebound.   Neurologic:  Alert and  oriented x4;  grossly normal neurologically.  Impression/Plan: Timothy Rose is here for an endoscopy to be performed for abdominal pain   Risks, benefits, limitations, and alternatives regarding   endoscopy have been reviewed with the patient.  Questions have been answered.  All parties agreeable.   Jonathon Bellows, MD  08/10/2016, 9:47 AM

## 2016-08-10 NOTE — Anesthesia Postprocedure Evaluation (Signed)
Anesthesia Post Note  Patient: Timothy Rose  Procedure(s) Performed: Procedure(s) (LRB): ESOPHAGOGASTRODUODENOSCOPY (EGD) WITH PROPOFOL (N/A)  Patient location during evaluation: Endoscopy Anesthesia Type: General Level of consciousness: awake and alert Pain management: pain level controlled Vital Signs Assessment: post-procedure vital signs reviewed and stable Respiratory status: spontaneous breathing, nonlabored ventilation, respiratory function stable and patient connected to nasal cannula oxygen Cardiovascular status: blood pressure returned to baseline and stable Postop Assessment: no signs of nausea or vomiting Anesthetic complications: no     Last Vitals:  Vitals:   08/10/16 1030 08/10/16 1040  BP: (!) 132/101 (!) 131/96  Pulse: 66 61  Resp: 13 10  Temp:      Last Pain:  Vitals:   08/10/16 1010  TempSrc: Tympanic                 Precious Haws Louetta Hollingshead

## 2016-08-10 NOTE — Transfer of Care (Signed)
Immediate Anesthesia Transfer of Care Note  Patient: Timothy Rose  Procedure(s) Performed: Procedure(s): ESOPHAGOGASTRODUODENOSCOPY (EGD) WITH PROPOFOL (N/A)  Patient Location: PACU  Anesthesia Type:General  Level of Consciousness: awake, alert , oriented and sedated  Airway & Oxygen Therapy: Patient Spontanous Breathing and Patient connected to nasal cannula oxygen  Post-op Assessment: Report given to RN and Post -op Vital signs reviewed and stable  Post vital signs: Reviewed and stable  Last Vitals:  Vitals:   08/10/16 0940  BP: 120/77  Pulse: 76  Resp: (!) 22  Temp: 36.9 C    Last Pain:  Vitals:   08/10/16 0940  TempSrc: Tympanic         Complications: No apparent anesthesia complications

## 2016-08-10 NOTE — Anesthesia Procedure Notes (Signed)
Performed by: Vaughan Sine Pre-anesthesia Checklist: Patient identified, Emergency Drugs available, Patient being monitored, Suction available and Timeout performed Patient Re-evaluated:Patient Re-evaluated prior to inductionPreoxygenation: Pre-oxygenation with 100% oxygen Intubation Type: IV induction Airway Equipment and Method: Bite block Placement Confirmation: CO2 detector and positive ETCO2

## 2016-08-10 NOTE — Op Note (Signed)
[facility] Gastroenterology Patient Name: Timothy Rose Procedure Date: 08/10/2016 9:46 AM MRN: [id number] Account #: [id number] Date of Birth: April 19, 1986 Admit Type: Outpatient Age: 31 Room: [facility] ENDO ROOM 4 Gender: Male Note Status: Finalized Procedure:            Upper GI endoscopy Indications:          Abdominal pain Providers:            Jonathon Bellows MD, MD Referring MD:         Leona Carry. Hall Busing, MD (Referring MD) Medicines:            Monitored Anesthesia Care Complications:        No immediate complications. Procedure:            Pre-Anesthesia Assessment:                       - Prior to the procedure, a History and Physical was                        performed, and patient medications, allergies and                        sensitivities were reviewed. The patient's tolerance of                        previous anesthesia was reviewed.                       - The risks and benefits of the procedure and the                        sedation options and risks were discussed with the                        patient. All questions were answered and informed                        consent was obtained.                       - The risks and benefits of the procedure and the                        sedation options and risks were discussed with the                        patient. All questions were answered and informed                        consent was obtained.                       - ASA Grade Assessment: III - A patient with severe                        systemic disease.                       After obtaining informed consent, the endoscope was  passed under direct vision. Throughout the procedure,                        the patient's blood pressure, pulse, and oxygen                        saturations were monitored continuously. The Endoscope                        was introduced through the mouth, and advanced to the   third part of duodenum. The upper GI endoscopy was                        accomplished with ease. The patient tolerated the                        procedure well. Findings:      [Extent] At the 35 cm mark there was a large 25 mm diameter patch which       had the appearance of an inlet patch, Biopsies were taken with a cold       forceps for histology.      Localized moderate inflammation characterized by congestion (edema) and       erythema was found in the gastric antrum. Biopsies were taken with a       cold forceps for histology.      The ampulla appeared protruding , polypoid in nature with a slight       pitted appearance. Impression:           - Mucosal changes in the esophagus. Biopsied.                       - Gastritis. Biopsied.                       - Duodenal polyps. Recommendation:       - Discharge patient to home (with escort).                       - Resume previous diet.                       - Await pathology results.                       - Perform an upper endoscopic ultrasound (UEUS) in 4                        weeks. Procedure Code(s):    --- Professional ---                       [phone], Esophagogastroduodenoscopy, flexible, transoral;                        with biopsy, single or multiple Diagnosis Code(s):    --- Professional ---                       K22.8, Other specified diseases of esophagus                       K29.70, Gastritis, unspecified, without bleeding  K31.7, Polyp of stomach and duodenum                       R10.9, Unspecified abdominal pain CPT copyright 2016 American Medical Association. All rights reserved. The codes documented in this report are preliminary and upon coder review may  be revised to meet current compliance requirements. Jonathon Bellows, MD Jonathon Bellows MD, MD 08/10/2016 10:08:50 AM This report has been signed electronically. Number of Addenda: 0 Note Initiated On: 08/10/2016 9:46 AM      [facility]

## 2016-08-10 NOTE — Anesthesia Post-op Follow-up Note (Cosign Needed)
Anesthesia QCDR form completed.        

## 2016-08-10 NOTE — Anesthesia Preprocedure Evaluation (Signed)
Anesthesia Evaluation  Patient identified by MRN, date of birth, ID band Patient awake    Reviewed: Allergy & Precautions, H&P , NPO status , Patient's Chart, lab work & pertinent test results  History of Anesthesia Complications Negative for: history of anesthetic complications  Airway Mallampati: II  TM Distance: >3 FB Neck ROM: full    Dental  (+) Poor Dentition, Chipped   Pulmonary neg shortness of breath, asthma ,    Pulmonary exam normal breath sounds clear to auscultation       Cardiovascular Exercise Tolerance: Good (-) angina(-) Past MI and (-) DOE negative cardio ROS Normal cardiovascular exam Rhythm:regular Rate:Normal     Neuro/Psych negative neurological ROS  negative psych ROS   GI/Hepatic Neg liver ROS, neg GERD  Medicated and Controlled,  Endo/Other  negative endocrine ROS  Renal/GU negative Renal ROS  negative genitourinary   Musculoskeletal   Abdominal   Peds  Hematology negative hematology ROS (+)   Anesthesia Other Findings Past Medical History: No date: Autism  Past Surgical History: No date: APPENDECTOMY No date: cranial surgery No date: INGUINAL HERNIA REPAIR Left 02/18/2015: LAPAROSCOPIC APPENDECTOMY N/A     Comment: Procedure: Laparoscopic appendectomy,               umbilical hernia repair;  Surgeon: Sherri Rad,               MD;  Location: [facility] ORS;  Service: General;                Laterality: N/A; No date: pyloric stenosis No date: SURGERY SCROTAL / TESTICULAR No date: TYMPANOSTOMY TUBE PLACEMENT     Reproductive/Obstetrics negative OB ROS                             Anesthesia Physical Anesthesia Plan  ASA: III  Anesthesia Plan: General   Post-op Pain Management:    Induction:   Airway Management Planned:   Additional Equipment:   Intra-op Plan:   Post-operative Plan:   Informed Consent: I have reviewed the patients History and  Physical, chart, labs and discussed the procedure including the risks, benefits and alternatives for the proposed anesthesia with the patient or authorized representative who has indicated his/her understanding and acceptance.   Dental Advisory Given  Plan Discussed with: Anesthesiologist, CRNA and Surgeon  Anesthesia Plan Comments: (Consent via mother and patient)        Anesthesia Quick Evaluation

## 2016-08-11 ENCOUNTER — Encounter: Payer: Self-pay | Admitting: Gastroenterology

## 2016-08-11 LAB — SURGICAL PATHOLOGY

## 2016-08-14 ENCOUNTER — Encounter: Payer: Self-pay | Admitting: Gastroenterology

## 2016-11-30 ENCOUNTER — Telehealth: Payer: Self-pay | Admitting: Gastroenterology

## 2016-11-30 NOTE — Telephone Encounter (Signed)
Patient's mother called and Timothy Rose is having abdominal pain for a couple weeks. Stomach looks bloated. Mother doesn't know what to do for him. [name] is autistic. Please call mother.

## 2016-12-04 NOTE — Telephone Encounter (Signed)
How often is he having a bowel movement and what mediciation is he on for constipation

## 2016-12-05 ENCOUNTER — Telehealth: Payer: Self-pay

## 2016-12-05 NOTE — Telephone Encounter (Signed)
Spoke to Mother concerning patients current condition.  She states the patient has a lot of gas and occasional stomach distention. Regular bowel movements.   Mother states patient came out of EGD with a horrified look on his face, therefore, she would like to discuss types of anesthesia.   Overall mother would like to schedule office appointment. Transferred the call to the scheduler.

## 2017-01-10 ENCOUNTER — Telehealth: Payer: Self-pay

## 2017-01-10 ENCOUNTER — Other Ambulatory Visit: Payer: Self-pay

## 2017-01-10 ENCOUNTER — Ambulatory Visit (INDEPENDENT_AMBULATORY_CARE_PROVIDER_SITE_OTHER): Payer: Medicare Other | Admitting: Gastroenterology

## 2017-01-10 ENCOUNTER — Encounter: Payer: Self-pay | Admitting: Gastroenterology

## 2017-01-10 VITALS — BP 135/79 | HR 99 | Temp 99.2°F | Ht 70.0 in | Wt 177.2 lb

## 2017-01-10 DIAGNOSIS — R197 Diarrhea, unspecified: Secondary | ICD-10-CM

## 2017-01-10 DIAGNOSIS — Z8371 Family history of colonic polyps: Secondary | ICD-10-CM

## 2017-01-10 DIAGNOSIS — K219 Gastro-esophageal reflux disease without esophagitis: Secondary | ICD-10-CM | POA: Diagnosis not present

## 2017-01-10 MED ORDER — RANITIDINE HCL 150 MG PO TABS: 150.0000 mg | ORAL_TABLET | Freq: Two times a day (BID) | ORAL | 2 refills | Status: DC

## 2017-01-10 NOTE — Telephone Encounter (Signed)
Gastroenterology Pre-Procedure Review  Request Date: 7/19 Requesting Physician: Dr. Vicente Males  PATIENT REVIEW QUESTIONS: The patient responded to the following health history questions as indicated:    1. Are you having any GI issues? yes (diarrhea) 2. Do you have a personal history of Polyps? no 3. Do you have a family history of Colon Cancer or Polyps? yes (family) 4. Diabetes Mellitus? no 5. Joint replacements in the past 12 months?no 6. Major health problems in the past 3 months?no 7. Any artificial heart valves, MVP, or defibrillator?no    MEDICATIONS & ALLERGIES:    Patient reports the following regarding taking any anticoagulation/antiplatelet therapy:   Plavix, Coumadin, Eliquis, Xarelto, Lovenox, Pradaxa, Brilinta, or Effient? no Aspirin? no  Patient confirms/reports the following medications:  Current Outpatient Prescriptions  Medication Sig Dispense Refill  . azelastine (ASTELIN) 0.1 % nasal spray     . dexlansoprazole (DEXILANT) 60 MG capsule Take 1 capsule (60 mg total) by mouth daily. (Patient not taking: Reported on 01/10/2017) 30 capsule 3  . diazepam (VALIUM) 10 MG tablet Take 10 mg by mouth 3 (three) times daily.    . divalproex (DEPAKOTE ER) 250 MG 24 hr tablet Take 250 mg by mouth daily.    Marland Kitchen FLUoxetine (PROZAC) 10 MG capsule Take by mouth.    . fluticasone (FLONASE) 50 MCG/ACT nasal spray     . lamoTRIgine (LAMICTAL) 25 MG tablet 25 mg x2 weeks, then 50 mg x2 weeks, 75 mg x2 weeks, then 100 mg every morning.    . Melatonin 10 MG TABS Take 10 mg by mouth at bedtime as needed.    . naproxen sodium (ANAPROX) 220 MG tablet Take 110 mg by mouth 2 (two) times daily with a meal.    . ofloxacin (OCUFLOX) 0.3 % ophthalmic solution     . Paliperidone (INVEGA) 1.5 MG TB24 Take 1.5 mg by mouth at bedtime.    Marland Kitchen PARoxetine (PAXIL-CR) 12.5 MG 24 hr tablet Take 12.5 mg by mouth daily.    . polyethylene glycol (GOLYTELY) 236 g solution Drink one 8oz glass every 20 mins until stools  are clear (Patient not taking: Reported on 01/10/2017) 4000 mL 0  . polyethylene glycol (MIRALAX / GLYCOLAX) packet Take 17 g by mouth daily as needed.    . polyethylene glycol (MIRALAX) packet Take 17 g by mouth daily. (Patient not taking: Reported on 01/10/2017) 30 each 3  . QUEtiapine (SEROQUEL) 25 MG tablet     . ranitidine (ZANTAC) 150 MG tablet Take 1 tablet (150 mg total) by mouth 2 (two) times daily. 60 tablet 2   No current facility-administered medications for this visit.     Patient confirms/reports the following allergies:  Allergies  Allergen Reactions  . Sulfa Antibiotics Hives and Anaphylaxis  . Haldol [Haloperidol] Swelling  . Viibryd [Vilazodone Hcl] Other (See Comments)    Gets aggressive     No orders of the defined types were placed in this encounter.   AUTHORIZATION INFORMATION Primary Insurance: 1D#: Group #:  Secondary Insurance: 1D#: Group #:  SCHEDULE INFORMATION: Date: 7/19 Time: Location: [place]

## 2017-01-10 NOTE — Progress Notes (Signed)
Jonathon Bellows MD, MRCP(U.K) [address]  [place]  [place], [place] [address]  Main: [phone]  Fax: [phone]   Primary Care Physician: Albina Billet, MD  Primary Gastroenterologist:  Dr. Jonathon Bellows   Chief Complaint  Patient presents with  . Diarrhea    Stopped Dexilant because Diarrhea  . Abdominal Pain  . Nausea  . Gas  . Gastroesophageal Reflux    HPI: Timothy Rose is a 31 y.o. male\  He is here today for a follow up . Initially seen in 05/2016 for GERD, abdominal pain , constipation .  GERD-burping , eats very fast  Constipation since birth , family history of colon polyps. Wanted to continue miralax.  Abdominal pain - of a few months all day long    Interval history 05/2016-12/2016   EGD showed gastric inflammation on biopsies.  The duodenal ampulla appeared a bit protruding on EGD and had a pitted appearance - My plan was for an EUS  Having diarrhea since a month. Unsure of number. No blood in the stool.   Was on dexilant was causing diarrhea- stopped by mother.   Current Outpatient Prescriptions  Medication Sig Dispense Refill  . azelastine (ASTELIN) 0.1 % nasal spray     . diazepam (VALIUM) 10 MG tablet Take 10 mg by mouth 3 (three) times daily.    . Melatonin 10 MG TABS Take 10 mg by mouth at bedtime as needed.    Marland Kitchen dexlansoprazole (DEXILANT) 60 MG capsule Take 1 capsule (60 mg total) by mouth daily. (Patient not taking: Reported on 01/10/2017) 30 capsule 3  . divalproex (DEPAKOTE ER) 250 MG 24 hr tablet Take 250 mg by mouth daily.    Marland Kitchen FLUoxetine (PROZAC) 10 MG capsule Take by mouth.    . fluticasone (FLONASE) 50 MCG/ACT nasal spray     . lamoTRIgine (LAMICTAL) 25 MG tablet 25 mg x2 weeks, then 50 mg x2 weeks, 75 mg x2 weeks, then 100 mg every morning.    . naproxen sodium (ANAPROX) 220 MG tablet Take 110 mg by mouth 2 (two) times daily with a meal.    . ofloxacin (OCUFLOX) 0.3 % ophthalmic solution     . Paliperidone (INVEGA) 1.5 MG TB24  Take 1.5 mg by mouth at bedtime.    Marland Kitchen PARoxetine (PAXIL-CR) 12.5 MG 24 hr tablet Take 12.5 mg by mouth daily.    . polyethylene glycol (GOLYTELY) 236 g solution Drink one 8oz glass every 20 mins until stools are clear (Patient not taking: Reported on 01/10/2017) 4000 mL 0  . polyethylene glycol (MIRALAX / GLYCOLAX) packet Take 17 g by mouth daily as needed.    . polyethylene glycol (MIRALAX) packet Take 17 g by mouth daily. (Patient not taking: Reported on 01/10/2017) 30 each 3  . QUEtiapine (SEROQUEL) 25 MG tablet      No current facility-administered medications for this visit.     Allergies as of 01/10/2017 - Review Complete 01/10/2017  Allergen Reaction Noted  . Sulfa antibiotics Hives and Anaphylaxis 01/30/2013  . Haldol [haloperidol] Swelling 02/18/2015  . Viibryd [vilazodone hcl] Other (See Comments) 02/18/2015    ROS:  General: Negative for anorexia, weight loss, fever, chills, fatigue, weakness. ENT: Negative for hoarseness, difficulty swallowing , nasal congestion. CV: Negative for chest pain, angina, palpitations, dyspnea on exertion, peripheral edema.  Respiratory: Negative for dyspnea at rest, dyspnea on exertion, cough, sputum, wheezing.  GI: See history of present illness. GU:  Negative for dysuria, hematuria, urinary incontinence,  urinary frequency, nocturnal urination.  Endo: Negative for unusual weight change.    Physical Examination:   BP 135/79   Pulse 99   Temp 99.2 F (37.3 C) (Oral)   Ht 5' 10"  (1.778 m)   Wt 177 lb 3.2 oz (80.4 kg)   BMI 25.43 kg/m   General: Well-nourished, well-developed in no acute distress.  Eyes: No icterus. Conjunctivae pink. Mouth: Oropharyngeal mucosa moist and pink , no lesions erythema or exudate. Lungs: Clear to auscultation bilaterally. Non-labored. Heart: Regular rate and rhythm, no murmurs rubs or gallops.  Abdomen: Bowel sounds are normal, nontender, nondistended, no hepatosplenomegaly or masses, no abdominal bruits or  hernia , no rebound or guarding.   Extremities: No lower extremity edema. No clubbing or deformities. Neuro: Alert and oriented x 3.  Grossly intact. Skin: Warm and dry, no jaundice.   Psych: Alert and cooperative, normal mood and affect.   Imaging Studies: No results found.  Assessment and Plan:   Timothy Rose is a 31 y.o. y/o male here for follow up for GERD. Since last visit has no constipation but has diarrhea. Also previously noted to have an abnormal appearing ampulla on EGD.    Plan  1. GERD- try zantac 150 mg BID 2. Burping - eat slowly , charcoal tablets trial.  3. Diarrhea - stool tests  4. Family history of colon polyps- colonoscopy with Suprep-sample provided 5. 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    I have discussed alternative options, risks & benefits,  which include, but are not limited to, bleeding, infection, perforation,respiratory complication & drug reaction.  The patient agrees with this plan & written consent will be obtained.    Dr Jonathon Bellows  MD,MRCP [facility]) Follow up in 3 months .

## 2017-01-11 ENCOUNTER — Telehealth: Payer: Self-pay

## 2017-01-11 ENCOUNTER — Telehealth: Payer: Self-pay | Admitting: Gastroenterology

## 2017-01-11 NOTE — Telephone Encounter (Signed)
01/11/17 NO prior auth required for MCR/MCD for Colonoscopy [phone] / R19.7.

## 2017-01-11 NOTE — Telephone Encounter (Signed)
  Oncology Nurse Navigator Documentation Voicemail left with Ms. Timothy Rose to return call for EUS scheduling referred by Dr. Vicente Males for [facility]. Navigator Location: [facility] (01/11/17 1400)   )Navigator Encounter Type: Telephone (01/11/17 1400) Telephone: Outgoing Call (01/11/17 1400)                       Barriers/Navigation Needs: Coordination of Care (01/11/17 1400)   Interventions: Coordination of Care (01/11/17 1400)   Coordination of Care: EUS (01/11/17 1400)                  Time Spent with Patient: 15 (01/11/17 1400)

## 2017-01-25 ENCOUNTER — Telehealth: Payer: Self-pay

## 2017-01-25 ENCOUNTER — Other Ambulatory Visit: Payer: Self-pay

## 2017-01-25 NOTE — Telephone Encounter (Signed)
  Oncology Nurse Navigator Documentation Call placed to [facility] for EUS scheduling. EUS scheduled for 03/08/17 at [place] with Dr. Francella Solian. Went over instructions and copy mailed to home address. Contact information provided for and further questions. Denies anticoagulants or diabetes.  INSTRUCTIONS FOR ENDOSCOPIC ULTRASOUND -Your procedure has been scheduled for August 23rd with Dr. Francella Solian at [facility]. -The hospital may contact you to pre-register over the phone.  -To get your scheduled arrival time, please call the Endoscopy unit at  [phone] between 1-3 p.m. on:  August 22nd   -ON THE DAY OF YOU PROCEDURE:   1. If you are scheduled for a morning procedure, nothing to drink after midnight  -If you are scheduled for an afternoon procedure, you may have clear liquids until 5 hours prior  to the procedure but no carbonated drinks or broth  2. NO FOOD THE DAY OF YOUR PROCEDURE  3. You may take your heart, seizure, blood pressure, Parkinson's or breathing medications at  6am with just enough water to get your pills down  4. Do not take any oral Diabetic medications the morning of your procedure.  5. If you are a diabetic and are using insulin, please notify your prescribing physician of this  procedure as your dose may need to be altered related to not being able to eat or drink.   5. Do not take vitamins, iron, or fish oil for 5 days before your procedure     -On the day of your procedure, come to the [facility] Admitting/Registration desk (First desk on the right) at the scheduled arrival time. You MUST have someone drive you home from your procedure. You must have a responsible adult with a valid driver's license who is on site throughout your entire procedure and who can stay with you for several hours after your procedure. You may not go home alone in a taxi, shuttle [place] or bus, as the drivers will not be responsible for you.  --If you have any questions  please call me at the above contact         Navigator Location: [facility] (01/25/17 1100)   )Navigator Encounter Type: Telephone (01/25/17 1100) Telephone: [place] Call (01/25/17 1100)                       Barriers/Navigation Needs: Coordination of Care (01/25/17 1100)   Interventions: Coordination of Care (01/25/17 1100)   Coordination of Care: EUS (01/25/17 1100)                  Time Spent with Patient: 30 (01/25/17 1100)

## 2017-01-26 ENCOUNTER — Other Ambulatory Visit
Admission: RE | Admit: 2017-01-26 | Discharge: 2017-01-26 | Disposition: A | Discharge status: Home / Self Care | Payer: Medicare Other | Source: Ambulatory Visit | Attending: Gastroenterology | Admitting: Gastroenterology

## 2017-01-26 DIAGNOSIS — R197 Diarrhea, unspecified: Secondary | ICD-10-CM | POA: Insufficient documentation

## 2017-01-26 LAB — C DIFFICILE QUICK SCREEN W PCR REFLEX
C DIFFICILE (CDIFF) TOXIN: NEGATIVE
C DIFFICLE (CDIFF) ANTIGEN: NEGATIVE
C Diff interpretation: NOT DETECTED

## 2017-01-30 ENCOUNTER — Telehealth: Payer: Self-pay

## 2017-01-30 NOTE — Telephone Encounter (Signed)
Advised mother of results per Dr. Vicente Males.   c diff negative

## 2017-01-30 NOTE — Telephone Encounter (Signed)
-----   Message from Jonathon Bellows, MD sent at 01/28/2017  5:49 PM EDT ----- c diff negative

## 2017-02-01 ENCOUNTER — Encounter: Admission: RE | Payer: Self-pay | Source: Ambulatory Visit

## 2017-02-01 ENCOUNTER — Ambulatory Visit: Admission: RE | Admit: 2017-02-01 | Payer: Medicare Other | Source: Ambulatory Visit | Admitting: Gastroenterology

## 2017-02-01 SURGERY — COLONOSCOPY WITH PROPOFOL
Anesthesia: General

## 2017-02-08 ENCOUNTER — Encounter
Admission: RE | Disposition: A | Discharge status: Home / Self Care | Payer: Self-pay | Source: Ambulatory Visit | Attending: Gastroenterology

## 2017-02-08 ENCOUNTER — Ambulatory Visit: Payer: Medicare Other | Admitting: Anesthesiology

## 2017-02-08 ENCOUNTER — Encounter: Payer: Self-pay | Admitting: *Deleted

## 2017-02-08 ENCOUNTER — Ambulatory Visit
Admission: RE | Admit: 2017-02-08 | Discharge: 2017-02-08 | Disposition: A | Discharge status: Home / Self Care | Payer: Medicare Other | Source: Ambulatory Visit | Attending: Gastroenterology | Admitting: Gastroenterology

## 2017-02-08 DIAGNOSIS — D120 Benign neoplasm of cecum: Secondary | ICD-10-CM

## 2017-02-08 DIAGNOSIS — Z7951 Long term (current) use of inhaled steroids: Secondary | ICD-10-CM | POA: Insufficient documentation

## 2017-02-08 DIAGNOSIS — R569 Unspecified convulsions: Secondary | ICD-10-CM | POA: Insufficient documentation

## 2017-02-08 DIAGNOSIS — Z79899 Other long term (current) drug therapy: Secondary | ICD-10-CM | POA: Insufficient documentation

## 2017-02-08 DIAGNOSIS — F840 Autistic disorder: Secondary | ICD-10-CM | POA: Insufficient documentation

## 2017-02-08 DIAGNOSIS — K635 Polyp of colon: Secondary | ICD-10-CM | POA: Diagnosis not present

## 2017-02-08 DIAGNOSIS — R197 Diarrhea, unspecified: Secondary | ICD-10-CM | POA: Diagnosis not present

## 2017-02-08 DIAGNOSIS — K529 Noninfective gastroenteritis and colitis, unspecified: Secondary | ICD-10-CM | POA: Insufficient documentation

## 2017-02-08 DIAGNOSIS — F419 Anxiety disorder, unspecified: Secondary | ICD-10-CM | POA: Diagnosis not present

## 2017-02-08 DIAGNOSIS — Z9049 Acquired absence of other specified parts of digestive tract: Secondary | ICD-10-CM | POA: Insufficient documentation

## 2017-02-08 DIAGNOSIS — F209 Schizophrenia, unspecified: Secondary | ICD-10-CM | POA: Insufficient documentation

## 2017-02-08 HISTORY — PX: COLONOSCOPY WITH PROPOFOL: SHX5780

## 2017-02-08 SURGERY — COLONOSCOPY WITH PROPOFOL
Anesthesia: General

## 2017-02-08 MED ORDER — KETAMINE HCL 50 MG/ML IJ SOLN
INTRAMUSCULAR | Status: AC
  Filled 2017-02-08: qty 10

## 2017-02-08 MED ORDER — PROPOFOL 500 MG/50ML IV EMUL
INTRAVENOUS | Status: AC
  Filled 2017-02-08: qty 50

## 2017-02-08 MED ORDER — MIDAZOLAM HCL 5 MG/5ML IJ SOLN
INTRAMUSCULAR | Status: DC | PRN
  Administered 2017-02-08: 2 mg via INTRAMUSCULAR

## 2017-02-08 MED ORDER — KETAMINE HCL 50 MG/ML IJ SOLN
INTRAMUSCULAR | Status: DC | PRN
  Administered 2017-02-08: 200 mg via INTRAMUSCULAR

## 2017-02-08 MED ORDER — MIDAZOLAM HCL 2 MG/2ML IJ SOLN
INTRAMUSCULAR | Status: AC
  Filled 2017-02-08: qty 2

## 2017-02-08 MED ORDER — SODIUM CHLORIDE 0.9 % IV SOLN
INTRAVENOUS | Status: DC
  Administered 2017-02-08: 09:00:00 via INTRAVENOUS

## 2017-02-08 MED ORDER — PROPOFOL 500 MG/50ML IV EMUL
INTRAVENOUS | Status: DC | PRN
  Administered 2017-02-08: 120 ug/kg/min via INTRAVENOUS

## 2017-02-08 NOTE — Anesthesia Post-op Follow-up Note (Cosign Needed)
Anesthesia QCDR form completed.        

## 2017-02-08 NOTE — H&P (Signed)
Jonathon Bellows MD [address] [place], [place] [address] Phone: [phone] Fax : [phone]  Primary Care Physician:  Albina Billet, MD Primary Gastroenterologist:  Dr. Jonathon Bellows   Pre-Procedure History & Physical: HPI:  Timothy Rose is a 31 y.o. male is here for an colonoscopy.   Past Medical History:  Diagnosis Date  . Anxiety   . Autism   . Schizophrenia ([place])   . Seizures ([place])     Past Surgical History:  Procedure Laterality Date  . APPENDECTOMY    . cranial surgery    . ESOPHAGOGASTRODUODENOSCOPY (EGD) WITH PROPOFOL N/A 08/10/2016   Procedure: ESOPHAGOGASTRODUODENOSCOPY (EGD) WITH PROPOFOL;  Surgeon: Jonathon Bellows, MD;  Location: [facility] ENDOSCOPY;  Service: Endoscopy;  Laterality: N/A;  . INGUINAL HERNIA REPAIR Left   . LAPAROSCOPIC APPENDECTOMY N/A 02/18/2015   Procedure: Laparoscopic appendectomy, umbilical hernia repair;  Surgeon: Sherri Rad, MD;  Location: [facility] ORS;  Service: General;  Laterality: N/A;  . pyloric stenosis    . SURGERY SCROTAL / TESTICULAR    . TYMPANOSTOMY TUBE PLACEMENT      Prior to Admission medications   Medication Sig Start Date End Date Taking? Authorizing Provider  diazepam (VALIUM) 10 MG tablet Take 10 mg by mouth 3 (three) times daily.   Yes [provider]  azelastine (ASTELIN) 0.1 % nasal spray  05/25/16   [provider]  dexlansoprazole (DEXILANT) 60 MG capsule Take 1 capsule (60 mg total) by mouth daily. Patient not taking: Reported on 01/10/2017 06/13/16   Hubbard Robinson, MD  FLUoxetine (PROZAC) 10 MG capsule Take by mouth.    [provider]  fluticasone Asencion Islam) 50 MCG/ACT nasal spray  05/25/16   [provider]  lamoTRIgine (LAMICTAL) 25 MG tablet 25 mg x2 weeks, then 50 mg x2 weeks, 75 mg x2 weeks, then 100 mg every morning. 09/04/16   [provider]  Melatonin 10 MG TABS Take 10 mg by mouth at bedtime as needed.    [provider]  naproxen sodium (ANAPROX) 220 MG  tablet Take 110 mg by mouth 2 (two) times daily with a meal.    [provider]  ofloxacin (OCUFLOX) 0.3 % ophthalmic solution  05/25/16   [provider]  Paliperidone (INVEGA) 1.5 MG TB24 Take 1.5 mg by mouth at bedtime.    [provider]  PARoxetine (PAXIL-CR) 12.5 MG 24 hr tablet Take 12.5 mg by mouth daily. 02/15/15 02/15/16  [provider]  polyethylene glycol (GOLYTELY) 236 g solution Drink one 8oz glass every 20 mins until stools are clear Patient not taking: Reported on 01/10/2017 08/02/16   Jonathon Bellows, MD  polyethylene glycol [facility] / Floria Raveling) packet Take 17 g by mouth daily as needed.    [provider]  polyethylene glycol (MIRALAX) packet Take 17 g by mouth daily. Patient not taking: Reported on 01/10/2017 06/13/16   Hubbard Robinson, MD  QUEtiapine (SEROQUEL) 25 MG tablet  06/05/16   [provider]  ranitidine (ZANTAC) 150 MG tablet Take 1 tablet (150 mg total) by mouth 2 (two) times daily. 01/10/17 03/12/17  Jonathon Bellows, MD    Allergies as of 01/31/2017 - Review Complete 01/10/2017  Allergen Reaction Noted  . Sulfa antibiotics Hives and Anaphylaxis 01/30/2013  . Haldol [haloperidol] Swelling 02/18/2015  . Viibryd [vilazodone hcl] Other (See Comments) 02/18/2015    Family History  Problem Relation Age of Onset  . Diabetes Mother     Social History   Social History  .  Marital status: Single    Spouse name: N/A  . Number of children: N/A  . Years of education: N/A   Occupational History  . Not on file.   Social History Main Topics  . Smoking status: Never Smoker  . Smokeless tobacco: Never Used  . Alcohol use No  . Drug use: No  . Sexual activity: Not on file   Other Topics Concern  . Not on file   Social History Narrative  . No narrative on file    Review of Systems: See HPI, otherwise negative ROS  Physical Exam: BP 128/87   Pulse 77   Temp 98.2 F (36.8 C)   Resp 18   Ht 5' 10"  (1.778 m)    Wt 177 lb (80.3 kg)   SpO2 99%   BMI 25.40 kg/m  General:   Alert,  pleasant and cooperative in NAD Head:  Normocephalic and atraumatic. Neck:  Supple; no masses or thyromegaly. Lungs:  Clear throughout to auscultation.    Heart:  Regular rate and rhythm. Abdomen:  Soft, nontender and nondistended. Normal bowel sounds, without guarding, and without rebound.   Neurologic:  Alert and  oriented x4;  grossly normal neurologically.  Impression/Plan: Timothy Rose is here for an colonoscopy to be performed for diarrhea   Risks, benefits, limitations, and alternatives regarding  colonoscopy have been reviewed with the patient and parents .  Questions have been answered.  All parties agreeable.   Jonathon Bellows, MD  02/08/2017, 8:26 AM

## 2017-02-08 NOTE — Anesthesia Preprocedure Evaluation (Signed)
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),  PSYCHIATRIC DISORDERS Anxiety Schizophrenia negative neurological ROS  negative psych ROS   GI/Hepatic Neg liver ROS, neg GERD  ,  Endo/Other  negative endocrine ROS  Renal/GU negative Renal ROS  negative genitourinary   Musculoskeletal   Abdominal   Peds  Hematology negative hematology ROS (+)   Anesthesia Other Findings Past Medical History: No date: Autism  Past Surgical History: No date: APPENDECTOMY No date: cranial surgery No date: INGUINAL HERNIA REPAIR Left 02/18/2015: LAPAROSCOPIC APPENDECTOMY N/A     Comment: Procedure: Laparoscopic appendectomy,               umbilical hernia repair;  Surgeon: Sherri Rad,               MD;  Location: [facility] ORS;  Service: General;                Laterality: N/A; No date: pyloric stenosis No date: SURGERY SCROTAL / TESTICULAR No date: TYMPANOSTOMY TUBE PLACEMENT     Reproductive/Obstetrics negative OB ROS                             Anesthesia Physical  Anesthesia Plan  ASA: III  Anesthesia Plan: General   Post-op Pain Management:    Induction: Intravenous  PONV Risk Score and Plan:   Airway Management Planned: Natural Airway and Nasal Cannula  Additional Equipment:   Intra-op Plan:   Post-operative Plan:   Informed Consent: I have reviewed  the patients History and Physical, chart, labs and discussed the procedure including the risks, benefits and alternatives for the proposed anesthesia with the patient or authorized representative who has indicated his/her understanding and acceptance.   Dental Advisory Given  Plan Discussed with: Anesthesiologist, CRNA and Surgeon  Anesthesia Plan Comments: (Consent via mother and patient  Mother requesting IM preMed.  Patient did have a seizure with birth, none since then, not on anti epileptic.  Plan to preMed with IM midazolam and ketamine.  Small risk of seizure discussed with family who voiced understanding.   Patient consented for risks of anesthesia including but not limited to:  - adverse reactions to medications - risk of intubation if required - damage to teeth, lips or other oral mucosa - sore throat or hoarseness - Damage to heart, brain, lungs or loss of life  Patient voiced understanding.)        Anesthesia Quick Evaluation

## 2017-02-08 NOTE — Transfer of Care (Signed)
    Immediate Anesthesia Transfer of Care Note  Patient: Timothy Rose  Procedure(s) Performed: Procedure(s): COLONOSCOPY WITH PROPOFOL (N/A)  Patient Location: PACU  Anesthesia Type:General  Level of Consciousness: awake and sedated  Airway & Oxygen Therapy: Patient Spontanous Breathing and Patient connected to nasal cannula oxygen  Post-op Assessment: Report given to RN and Post -op Vital signs reviewed and stable  Post vital signs: Reviewed and stable  Last Vitals:  Vitals:   02/08/17 0814  BP: 128/87  Pulse: 77  Resp: 18  Temp: 36.8 C    Last Pain: There were no vitals filed for this visit.       Complications: No apparent anesthesia complications

## 2017-02-08 NOTE — Op Note (Signed)
[facility] Gastroenterology Patient Name: Timothy Rose Procedure Date: 02/08/2017 9:16 AM MRN: [id number] Account #: [id number] Date of Birth: 1986/06/29 Admit Type: Outpatient Age: 31 Room: [facility] ENDO ROOM 4 Gender: Male Note Status: Finalized Procedure:            Colonoscopy Indications:          Chronic diarrhea Providers:            Jonathon Bellows MD, MD Referring MD:         Leona Carry. Hall Busing, MD (Referring MD) Medicines:            Monitored Anesthesia Care Complications:        No immediate complications. Procedure:            Pre-Anesthesia Assessment:                       - Prior to the procedure, a History and Physical was                        performed, and patient medications, allergies and                        sensitivities were reviewed. The patient's tolerance of                        previous anesthesia was reviewed.                       - The risks and benefits of the procedure and the                        sedation options and risks were discussed with the                        patient. All questions were answered and informed                        consent was obtained.                       - ASA Grade Assessment: III - A patient with severe                        systemic disease.                       After obtaining informed consent, the colonoscope was                        passed under direct vision. Throughout the procedure,                        the patient's blood pressure, pulse, and oxygen                        saturations were monitored continuously. The Olympus                        CF-H180AL colonoscope ( S#: [id number] ) was introduced  through the anus and advanced to the the terminal                        ileum. The colonoscopy was performed with ease. The                        patient tolerated the procedure well. The quality of                        the bowel preparation was good. Findings:  The perianal and digital rectal examinations were normal.      The terminal ileum appeared normal. Biopsies were taken with a cold       forceps for histology.      Normal mucosa was found in the entire colon. Biopsies were taken with a       cold forceps for histology. Biopsies for histology were taken with a       cold forceps from the right transverse colon, sigmoid colon and rectum       for evaluation of microscopic colitis.      A 3 mm polyp was found in the cecum. The polyp was sessile. The polyp       was removed with a cold biopsy forceps. Resection and retrieval were       complete.      The exam was otherwise without abnormality on direct and retroflexion       views. Impression:           - The examined portion of the ileum was normal.                        Biopsied.                       - Normal mucosa in the entire examined colon. Biopsied.                       - One 3 mm polyp in the cecum, removed with a cold                        biopsy forceps. Resected and retrieved.                       - The examination was otherwise normal on direct and                        retroflexion views. Recommendation:       - Discharge patient to home (with escort).                       - Advance diet as tolerated.                       - Continue present medications.                       - Await pathology results.                       - Return to my office in 2 months. Procedure Code(s):    --- Professional ---  45380, Colonoscopy, flexible; with biopsy, single or                        multiple Diagnosis Code(s):    --- Professional ---                       D12.0, Benign neoplasm of cecum                       K52.9, Noninfective gastroenteritis and colitis,                        unspecified CPT copyright 2016 American Medical Association. All rights reserved. The codes documented in this report are preliminary and upon coder review may  be revised to  meet current compliance requirements. Jonathon Bellows, MD Jonathon Bellows MD, MD 02/08/2017 9:54:16 AM This report has been signed electronically. Number of Addenda: 0 Note Initiated On: 02/08/2017 9:16 AM Scope Withdrawal Time: 0 hours 15 minutes 10 seconds  Total Procedure Duration: 0 hours 24 minutes 2 seconds       [facility]

## 2017-02-08 NOTE — Anesthesia Procedure Notes (Signed)
Performed by: Vaughan Sine Pre-anesthesia Checklist: Patient identified, Emergency Drugs available, Suction available, Patient being monitored and Timeout performed Patient Re-evaluated:Patient Re-evaluated prior to induction Oxygen Delivery Method: Nasal cannula Preoxygenation: Pre-oxygenation with 100% oxygen Induction Type: IV induction Placement Confirmation: positive ETCO2 and CO2 detector

## 2017-02-09 ENCOUNTER — Encounter: Payer: Self-pay | Admitting: Gastroenterology

## 2017-02-09 NOTE — Anesthesia Postprocedure Evaluation (Signed)
Anesthesia Post Note  Patient: Timothy Rose  Procedure(s) Performed: Procedure(s) (LRB): COLONOSCOPY WITH PROPOFOL (N/A)  Patient location during evaluation: Endoscopy Anesthesia Type: General Level of consciousness: awake and alert Pain management: pain level controlled Vital Signs Assessment: post-procedure vital signs reviewed and stable Respiratory status: spontaneous breathing, nonlabored ventilation, respiratory function stable and patient connected to nasal cannula oxygen Cardiovascular status: blood pressure returned to baseline and stable Postop Assessment: no signs of nausea or vomiting Anesthetic complications: no     Last Vitals:  Vitals:   02/08/17 1039 02/08/17 1120  BP: (!) 80/54   Pulse: 70 68  Resp: 18 18  Temp: 36.8 C     Last Pain:  Vitals:   02/08/17 1039  TempSrc: Tympanic                 Precious Haws Temitope Flammer

## 2017-02-13 LAB — SURGICAL PATHOLOGY

## 2017-03-07 ENCOUNTER — Encounter: Payer: Self-pay | Admitting: *Deleted

## 2017-03-08 ENCOUNTER — Encounter: Payer: Self-pay | Admitting: Anesthesiology

## 2017-03-08 ENCOUNTER — Ambulatory Visit
Admission: RE | Admit: 2017-03-08 | Discharge: 2017-03-08 | Disposition: A | Discharge status: Home / Self Care | Payer: Medicare Other | Source: Ambulatory Visit | Attending: Gastroenterology | Admitting: Gastroenterology

## 2017-03-08 ENCOUNTER — Ambulatory Visit: Payer: Medicare Other | Admitting: Anesthesiology

## 2017-03-08 ENCOUNTER — Encounter
Admission: RE | Disposition: A | Discharge status: Home / Self Care | Payer: Self-pay | Source: Ambulatory Visit | Attending: Gastroenterology

## 2017-03-08 DIAGNOSIS — R569 Unspecified convulsions: Secondary | ICD-10-CM | POA: Diagnosis not present

## 2017-03-08 DIAGNOSIS — K2980 Duodenitis without bleeding: Secondary | ICD-10-CM | POA: Insufficient documentation

## 2017-03-08 DIAGNOSIS — R1909 Other intra-abdominal and pelvic swelling, mass and lump: Secondary | ICD-10-CM | POA: Diagnosis present

## 2017-03-08 DIAGNOSIS — F840 Autistic disorder: Secondary | ICD-10-CM | POA: Diagnosis not present

## 2017-03-08 DIAGNOSIS — Z9049 Acquired absence of other specified parts of digestive tract: Secondary | ICD-10-CM | POA: Insufficient documentation

## 2017-03-08 DIAGNOSIS — Z791 Long term (current) use of non-steroidal anti-inflammatories (NSAID): Secondary | ICD-10-CM | POA: Diagnosis not present

## 2017-03-08 DIAGNOSIS — F209 Schizophrenia, unspecified: Secondary | ICD-10-CM | POA: Diagnosis not present

## 2017-03-08 DIAGNOSIS — F419 Anxiety disorder, unspecified: Secondary | ICD-10-CM | POA: Diagnosis not present

## 2017-03-08 DIAGNOSIS — Z79899 Other long term (current) drug therapy: Secondary | ICD-10-CM | POA: Insufficient documentation

## 2017-03-08 HISTORY — PX: EUS: SHX5427

## 2017-03-08 SURGERY — ULTRASOUND, UPPER GI TRACT, ENDOSCOPIC
Anesthesia: General

## 2017-03-08 MED ORDER — PROPOFOL 10 MG/ML IV BOLUS
INTRAVENOUS | Status: DC | PRN
  Administered 2017-03-08: 50 mg via INTRAVENOUS
  Administered 2017-03-08: 20 mg via INTRAVENOUS
  Administered 2017-03-08: 30 mg via INTRAVENOUS

## 2017-03-08 MED ORDER — MIDAZOLAM HCL 2 MG/2ML IJ SOLN
INTRAMUSCULAR | Status: DC | PRN
  Administered 2017-03-08: 2 mg via INTRAVENOUS

## 2017-03-08 MED ORDER — LIDOCAINE HCL (PF) 2 % IJ SOLN
INTRAMUSCULAR | Status: AC
  Filled 2017-03-08: qty 2

## 2017-03-08 MED ORDER — MIDAZOLAM HCL 2 MG/2ML IJ SOLN
INTRAMUSCULAR | Status: AC
  Filled 2017-03-08: qty 2

## 2017-03-08 MED ORDER — LIDOCAINE HCL (CARDIAC) 20 MG/ML IV SOLN
INTRAVENOUS | Status: DC | PRN
  Administered 2017-03-08: 60 mg via INTRAVENOUS

## 2017-03-08 MED ORDER — PROPOFOL 500 MG/50ML IV EMUL
INTRAVENOUS | Status: AC
  Filled 2017-03-08: qty 50

## 2017-03-08 MED ORDER — PROPOFOL 500 MG/50ML IV EMUL
INTRAVENOUS | Status: DC | PRN
  Administered 2017-03-08: 160 ug/kg/min via INTRAVENOUS

## 2017-03-08 MED ORDER — SODIUM CHLORIDE 0.9 % IV SOLN
INTRAVENOUS | Status: DC
  Administered 2017-03-08: 13:00:00 via INTRAVENOUS

## 2017-03-08 NOTE — Transfer of Care (Signed)
Immediate Anesthesia Transfer of Care Note  Patient: Timothy Rose  Procedure(s) Performed: Procedure(s): FULL UPPER ENDOSCOPIC ULTRASOUND (EUS) RADIAL (N/A)  Patient Location: PACU  Anesthesia Type:General  Level of Consciousness: sedated  Airway & Oxygen Therapy: Patient Spontanous Breathing and Patient connected to nasal cannula oxygen  Post-op Assessment: Report given to RN and Post -op Vital signs reviewed and stable  Post vital signs: Reviewed and stable  Last Vitals:  Vitals:   03/08/17 1217 03/08/17 1340  BP: 120/81 104/77  Pulse: 78 88  Resp: 18 16  Temp: 37.4 C (!) (P) 35.9 C  SpO2: 100% 96%    Last Pain:  Vitals:   03/08/17 1340  TempSrc: (P) Tympanic         Complications: No apparent anesthesia complications

## 2017-03-08 NOTE — Anesthesia Post-op Follow-up Note (Signed)
Anesthesia QCDR form completed.        

## 2017-03-08 NOTE — Op Note (Signed)
[facility] Gastroenterology Patient Name: Timothy Rose Procedure Date: 03/08/2017 12:40 PM MRN: [id number] Account #: [id number] Date of Birth: 11-30-1985 Admit Type: Outpatient Age: 31 Room: [facility] ENDO ROOM 3 Gender: Male Note Status: Finalized Procedure:            Upper EUS Indications:          Mucosal mass/polyp involving the major papilla (found                        on endoscopy) Providers:            Zada Girt Referring MD:         Jonathon Bellows MD, MD (Referring MD), Leona Carry. Hall Busing, MD                        (Referring MD) Medicines:            Monitored Anesthesia Care Complications:        No immediate complications. Procedure:            Pre-Anesthesia Assessment:                       - Please see pre-anesthesia assessment documentation                        already completed in Epic.                       After obtaining informed consent, the endoscope was                        passed under direct vision. Throughout the procedure,                        the patient's blood pressure, pulse, and oxygen                        saturations were monitored continuously. The EUS GI                        Radial Array [id number] was introduced through the mouth,                        and advanced to the duodenum for ultrasound examination                        from the stomach and duodenum. Side viewing scope                        passed to second part of the duodenum. The upper EUS                        was accomplished without difficulty. The patient                        tolerated the procedure well. Findings:      Endoscopic Finding :      The entire examined stomach was grossly endoscopically normal seen with       side viewing scope. Cardia/fundus not well seen.      Localized mild mucosal changes characterized by altered texture  were       found in the ampulla. Biopsies were taken with a cold forceps for       histology.      Endosonographic  Finding :      There was no sign of significant endosonographic abnormality in the       ampulla.      There was no sign of significant endosonographic abnormality in the       pancreatic head, in the genu of the pancreas, in the pancreatic body, in       the pancreatic tail and in the main pancreatic duct. The pancreatic duct       measured up to 2 mm in diameter.      There was no sign of significant endosonographic abnormality in the       common bile duct. The maximum diameter of the duct was 6 mm. Impression:           - Esophagus not examined with duodenoscope.                       - Stomach grossly normal with limited exam with                        duodenoscope.                       - Mucosal changes in the duodenum at the ampulla - not                        likely to be adenoma. Biopsied.                       EUS:                       - There was no sign of significant pathology in the                        ampulla.                       - There was no sign of significant pathology in the                        pancreatic head, in the genu of the pancreas, in the                        pancreatic body, in the pancreatic tail and in the main                        pancreatic duct.                       - There was no sign of significant pathology in the                        common bile duct. Recommendation:       - Await pathology results. If no adenoma found and no                        other worrisome findings on biopsy, would not pursue  further work up.                       - Patient has a contact number available for                        emergencies. The signs and symptoms of potential                        delayed complications were discussed with the patient.                        Return to normal activities tomorrow. Written discharge                        instructions were provided to the patient.                       - Return to  referring physician.                       - The findings and recommendations were discussed with                        the patient.                       - The findings and recommendations were discussed with                        the designated responsible adult. Procedure Code(s):    --- Professional ---                       [phone], Esophagogastroduodenoscopy, flexible, transoral;                        with endoscopic ultrasound examination limited to the                        esophagus, stomach or duodenum, and adjacent structures                       70488, 59, Esophagogastroduodenoscopy, flexible,                        transoral; with biopsy, single or multiple CPT copyright 2016 American Medical Association. All rights reserved. The codes documented in this report are preliminary and upon coder review may  be revised to meet current compliance requirements. Attending Participation:      I personally performed the entire procedure. Zada Girt,  03/08/2017 1:53:23 PM This report has been signed electronically. Number of Addenda: 0 Note Initiated On: 03/08/2017 12:40 PM      [facility]

## 2017-03-08 NOTE — Anesthesia Preprocedure Evaluation (Addendum)
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),  PSYCHIATRIC DISORDERS Anxiety Schizophrenia negative neurological ROS  negative psych ROS   GI/Hepatic Neg liver ROS, neg GERD  ,  Endo/Other  negative endocrine ROS  Renal/GU negative Renal ROS  negative genitourinary   Musculoskeletal   Abdominal   Peds  Hematology negative hematology ROS (+)   Anesthesia Other Findings Past Medical History: No date: Autism  Past Surgical History: No date: APPENDECTOMY No date: cranial surgery No date: INGUINAL HERNIA REPAIR Left 02/18/2015: LAPAROSCOPIC APPENDECTOMY N/A     Comment: Procedure: Laparoscopic appendectomy,               umbilical hernia repair;  Surgeon: Sherri Rad,               MD;  Location: [facility] ORS;  Service: General;                Laterality: N/A; No date: pyloric stenosis No date: SURGERY SCROTAL / TESTICULAR No date: TYMPANOSTOMY TUBE PLACEMENT     Reproductive/Obstetrics negative OB ROS                             Anesthesia Physical  Anesthesia Plan  ASA: III  Anesthesia Plan: General   Post-op Pain Management:    Induction: Intravenous  PONV Risk Score and Plan:   Airway Management Planned: Natural Airway and Nasal Cannula  Additional Equipment:   Intra-op Plan:   Post-operative Plan:   Informed Consent: I have reviewed  the patients History and Physical, chart, labs and discussed the procedure including the risks, benefits and alternatives for the proposed anesthesia with the patient or authorized representative who has indicated his/her understanding and acceptance.   Dental Advisory Given  Plan Discussed with: Anesthesiologist, CRNA and Surgeon  Anesthesia Plan Comments: (Consent via mother and patient  Patient consented for risks of anesthesia including but not limited to:  - adverse reactions to medications - risk of intubation if required - damage to teeth, lips or other oral mucosa - sore throat or hoarseness - Damage to heart, brain, lungs or loss of life  Patient voiced understanding.)       Anesthesia Quick Evaluation

## 2017-03-08 NOTE — Anesthesia Postprocedure Evaluation (Signed)
Anesthesia Post Note  Patient: Timothy Rose  Procedure(s) Performed: Procedure(s) (LRB): FULL UPPER ENDOSCOPIC ULTRASOUND (EUS) RADIAL (N/A)  Patient location during evaluation: Endoscopy Anesthesia Type: General Level of consciousness: awake and alert Pain management: pain level controlled Vital Signs Assessment: post-procedure vital signs reviewed and stable Respiratory status: spontaneous breathing, nonlabored ventilation, respiratory function stable and patient connected to nasal cannula oxygen Cardiovascular status: blood pressure returned to baseline and stable Postop Assessment: no signs of nausea or vomiting Anesthetic complications: no     Last Vitals:  Vitals:   03/08/17 1350 03/08/17 1400  BP: 104/76 105/69  Pulse: 78 64  Resp: (!) 21 14  Temp:    SpO2: 95% 98%    Last Pain:  Vitals:   03/08/17 1340  TempSrc: Tympanic                 Martha Clan

## 2017-03-08 NOTE — H&P (Signed)
Outpatient short stay form Pre-procedure 03/08/2017 12:56 PM Timothy Rose,  Timothy Hines, MD  Primary Physician: Albina Billet, MD   Reason for visit:  No chief complaint on file.   No diagnosis found.   History of present illness:  Possible abnormal ampulla on recent EGD. FOR EUS    Current Facility-Administered Medications:  .  0.9 %  sodium chloride infusion, , Intravenous, Continuous, Jola Schmidt, MD  Facility-Administered Medications Ordered in Other Encounters:  .  midazolam (VERSED) injection, , , Anesthesia Intra-op, Dionne Bucy, CRNA, 2 mg at 03/08/17 1256  Prescriptions Prior to Admission  Medication Sig Dispense Refill Last Dose  . diazepam (VALIUM) 10 MG tablet Take 10 mg by mouth 3 (three) times daily.   03/07/2017 at Unknown time  . Melatonin 10 MG TABS Take 10 mg by mouth at bedtime as needed.   03/07/2017 at Unknown time  . naproxen sodium (ANAPROX) 220 MG tablet Take 110 mg by mouth 2 (two) times daily with a meal.   03/08/2017 at Unknown time  . polyethylene glycol (MIRALAX / GLYCOLAX) packet Take 17 g by mouth daily as needed.   Past Week at Unknown time  . azelastine (ASTELIN) 0.1 % nasal spray    Not Taking at Unknown time  . dexlansoprazole (DEXILANT) 60 MG capsule Take 1 capsule (60 mg total) by mouth daily. (Patient not taking: Reported on 01/10/2017) 30 capsule 3 Not Taking at Unknown time  . FLUoxetine (PROZAC) 10 MG capsule Take by mouth.   Not Taking at Unknown time  . fluticasone (FLONASE) 50 MCG/ACT nasal spray    Not Taking at Unknown time  . lamoTRIgine (LAMICTAL) 25 MG tablet 25 mg x2 weeks, then 50 mg x2 weeks, 75 mg x2 weeks, then 100 mg every morning.   Not Taking at Unknown time  . ofloxacin (OCUFLOX) 0.3 % ophthalmic solution    Not Taking at Unknown time  . Paliperidone (INVEGA) 1.5 MG TB24 Take 1.5 mg by mouth at bedtime.   Not Taking at Unknown time  . PARoxetine (PAXIL-CR) 12.5 MG 24 hr tablet Take 12.5 mg by mouth daily.   Taking  . polyethylene  glycol (GOLYTELY) 236 g solution Drink one 8oz glass every 20 mins until stools are clear (Patient not taking: Reported on 01/10/2017) 4000 mL 0 Not Taking  . polyethylene glycol (MIRALAX) packet Take 17 g by mouth daily. (Patient not taking: Reported on 01/10/2017) 30 each 3 Not Taking  . QUEtiapine (SEROQUEL) 25 MG tablet    Not Taking at Unknown time  . ranitidine (ZANTAC) 150 MG tablet Take 1 tablet (150 mg total) by mouth 2 (two) times daily. 60 tablet 2 01/03/2017    Allergies  Allergen Reactions  . Sulfa Antibiotics Hives and Anaphylaxis  . Haldol [Haloperidol] Swelling  . Viibryd [employer] Hcl] Other (See Comments)    Gets aggressive     Past Medical History:  Diagnosis Date  . Anxiety   . Autism   . Schizophrenia ([place])   . Seizures [facility])           Physical Exam     Pertinant exam for procedure: Alert but autistic. Mother present.  Abd soft NT    Planned proceedures: EUS of ampulla    Zada Girt MD Gastroenterology 03/08/2017  12:56 PM

## 2017-03-09 LAB — SURGICAL PATHOLOGY

## 2017-03-12 NOTE — Progress Notes (Signed)
  Oncology Nurse Navigator Documentation EUS report and pathology routed to Dr. Cy Blamer Location: [facility] (03/12/17 1000)   )Navigator Encounter Type: Diagnostic Results (03/12/17 1000)                         Barriers/Navigation Needs: Coordination of Care (03/12/17 1000)   Interventions: Coordination of Care (03/12/17 1000)   Coordination of Care: EUS (03/12/17 1000)                  Time Spent with Patient: 15 (03/12/17 1000)

## 2017-03-14 ENCOUNTER — Encounter: Payer: Self-pay | Admitting: Gastroenterology

## 2017-03-15 ENCOUNTER — Telehealth: Payer: Self-pay

## 2017-03-15 NOTE — Telephone Encounter (Signed)
LVM for patient callback for results per Dr. Vicente Males.

## 2017-03-15 NOTE — Telephone Encounter (Signed)
-----   Message from Jonathon Bellows, MD sent at 03/14/2017 10:50 AM EDT ----- Inform patient - biosies are benign from recent EUS ans show only inflammation or duodenitis- suggest avoid NSAID's

## 2017-10-08 ENCOUNTER — Other Ambulatory Visit: Payer: Self-pay | Admitting: Gastroenterology

## 2018-02-23 IMAGING — CT CT HEAD W/O CM
3 series · 16 of 47 positions shown, 19 images · non-contrast
Comparison: 03/03/2015

CLINICAL DATA: Off system, hearing loss

EXAM:
CT HEAD WITHOUT CONTRAST
TECHNIQUE: Contiguous axial images were obtained from the base of the skull
through the vertex without intravenous contrast.

[Series 2: head wo · axial · 0.41mm/px · z∈[-191,-66]mm · 10 of 30 slices shown, 13 images]
[im 3/30  brain]
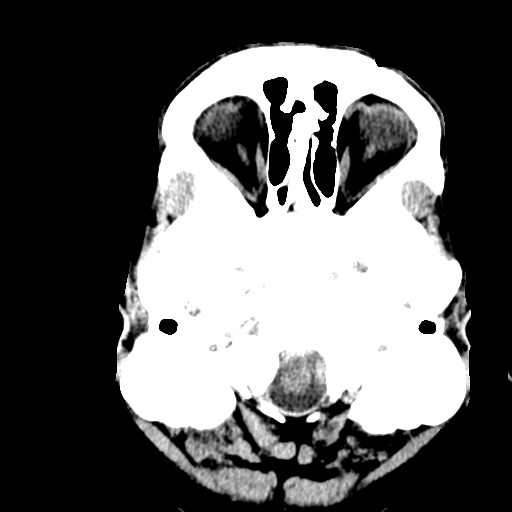
[im 3/30  bone]
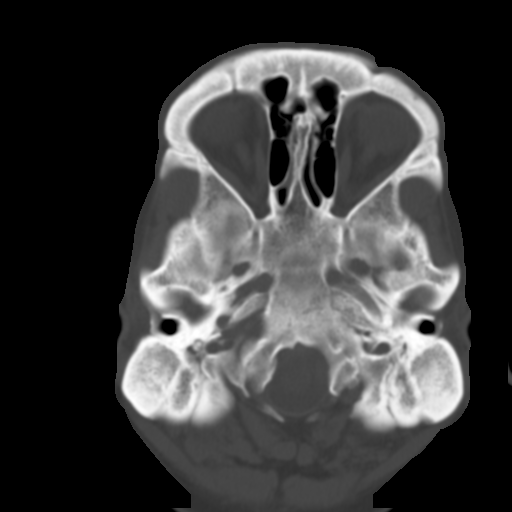
[im 6/30  brain]
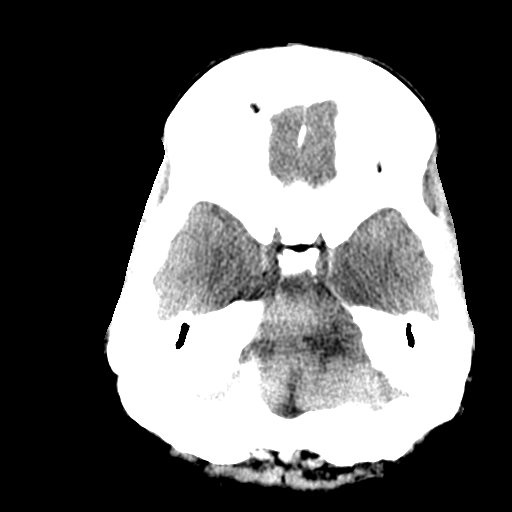
[im 9/30  brain]
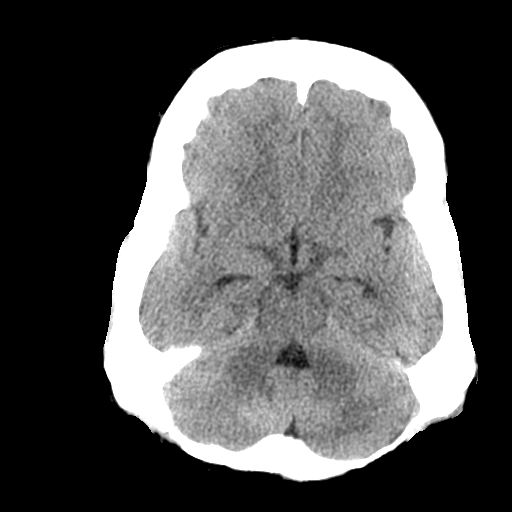
[im 11/30  brain]
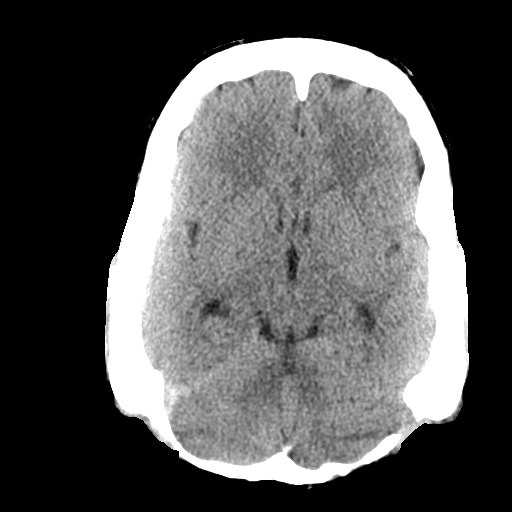
[im 14/30  brain]
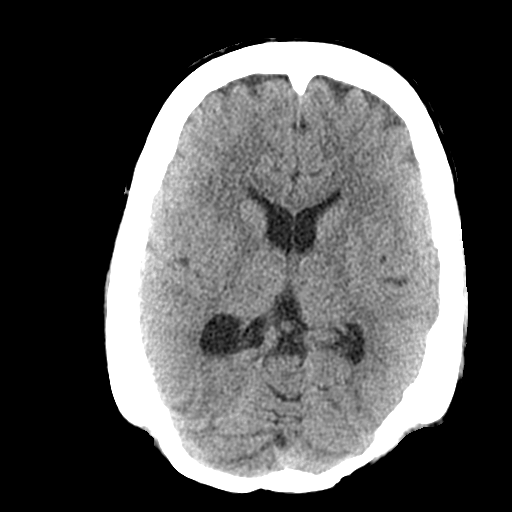
[im 14/30  bone]
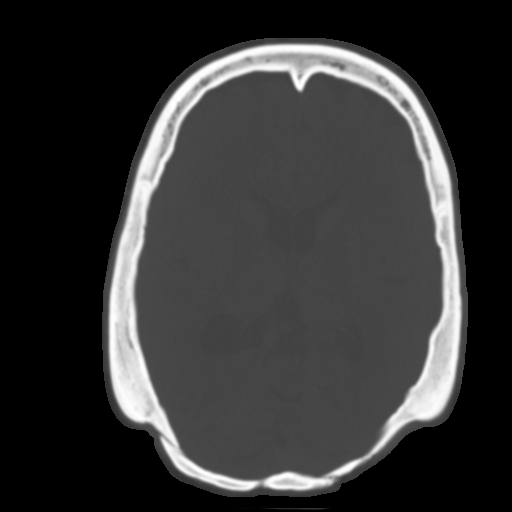
[im 17/30  brain]
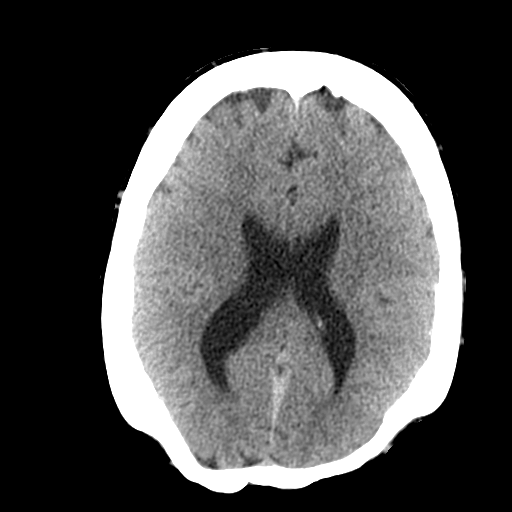
[im 20/30  brain]
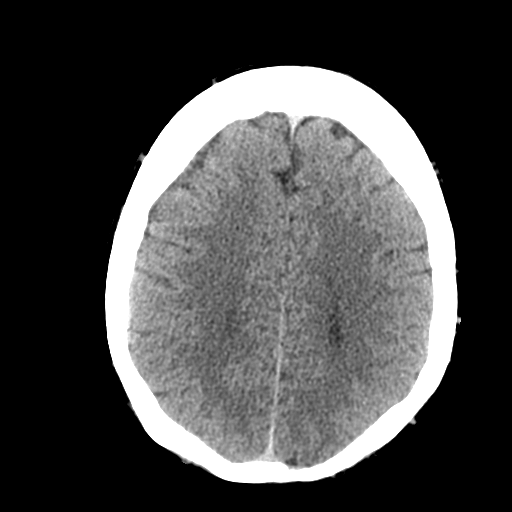
[im 23/30  brain]
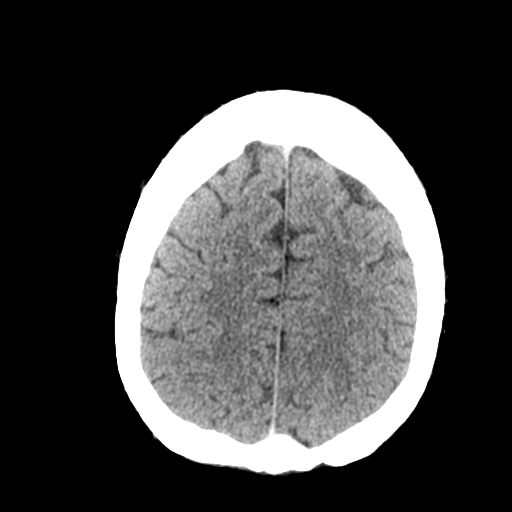
[im 25/30  brain]
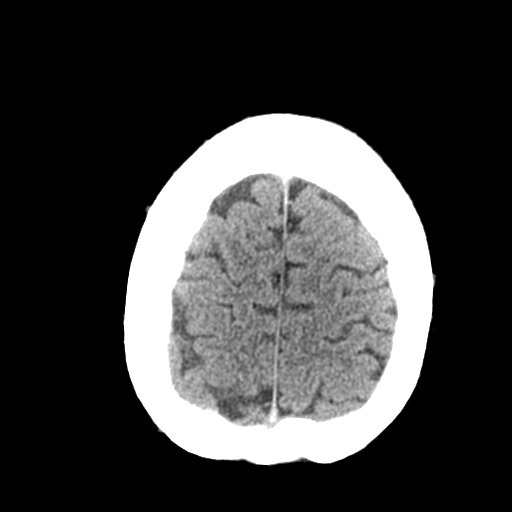
[im 25/30  bone]
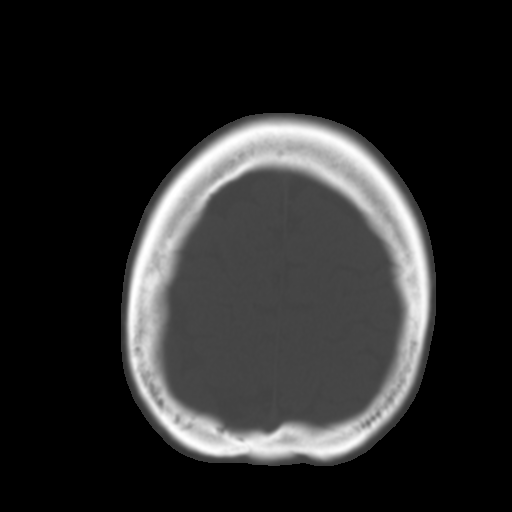
[im 28/30  brain]
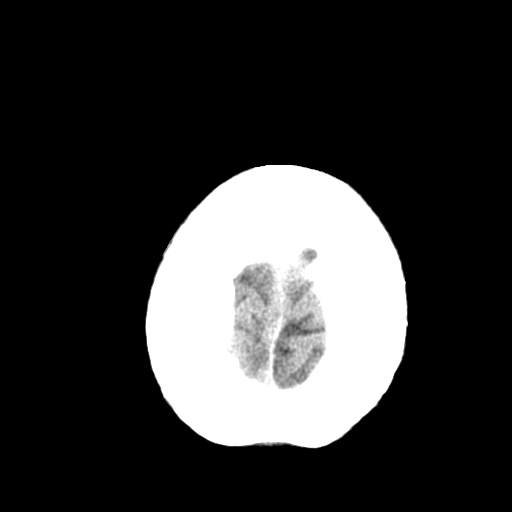

[Series 4: coronal soft tissue · coronal · 0.33mm/px · 3 of 70 slices shown]
[im 24/70  brain]
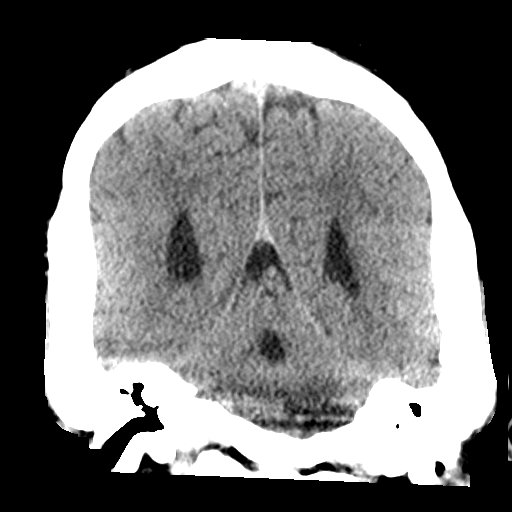
[im 31/70  brain]
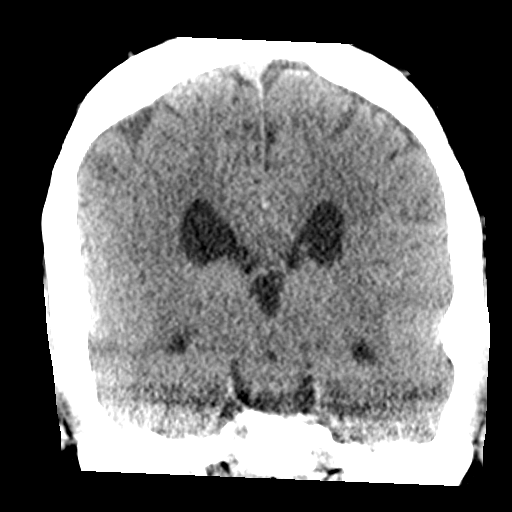
[im 39/70  brain]
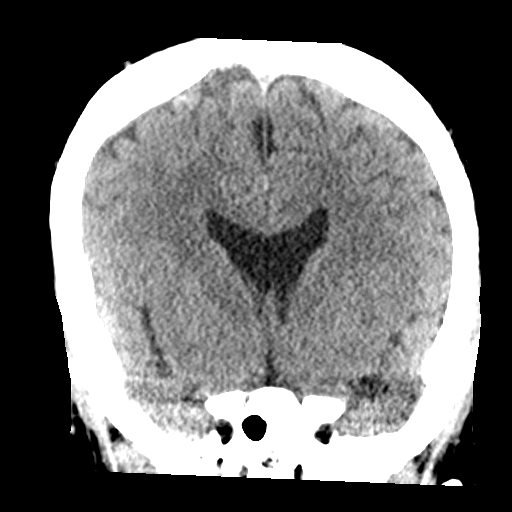

[Series 5: sagittal soft tissue · sagittal · 0.34mm/px · 3 of 59 slices shown]
[im 20/59  brain]
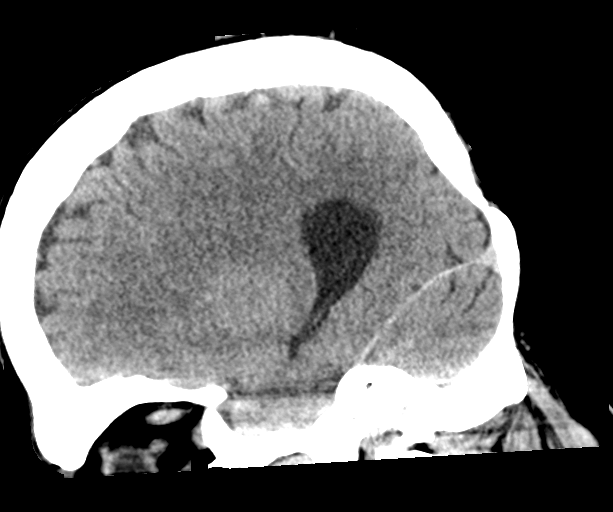
[im 30/59  brain]
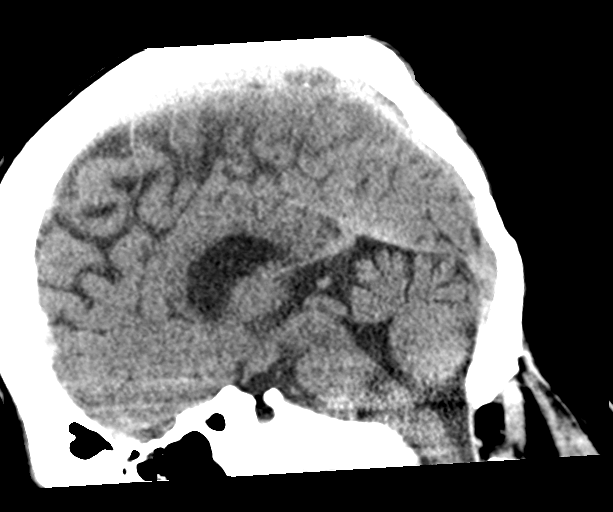
[im 39/59  brain]
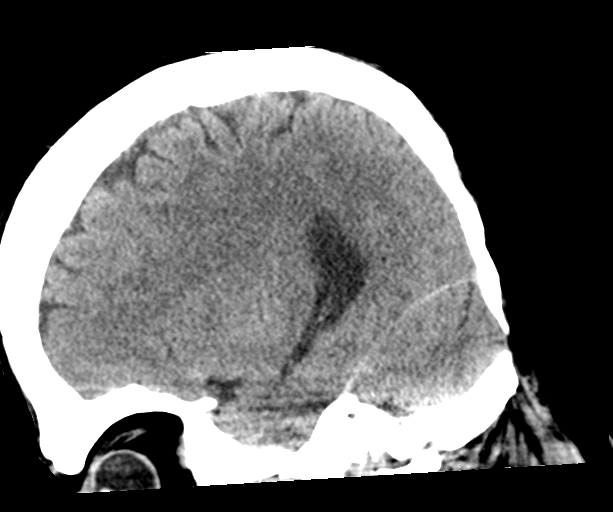

[16 of 47 positions shown; findings below may reference images not displayed]

FINDINGS: Brain: No evidence of acute infarction, hemorrhage, hydrocephalus,
extra-axial collection or mass lesion/mass effect.

Vascular: No hyperdense vessel or unexpected calcification.

Skull: Postsurgical changes are noted in the posterior fossa. Mild
hyperostosis is seen stable from the prior exam. Posterior fusion
defect of C1 is noted stable from the previous exam.

Sinuses/Orbits: No acute finding.

Other: Mastoid air cells are non aerated. This is stable from the
prior exam.
IMPRESSION: No acute abnormality noted. Stable changes when compare with the
prior exam.

## 2020-08-10 ENCOUNTER — Telehealth: Payer: Self-pay | Admitting: Gastroenterology

## 2020-08-10 NOTE — Telephone Encounter (Signed)
Mother called for Autistic son, has questions regarding stool softener.  Has appt 3/22 with Dr Vicente Males

## 2020-08-10 NOTE — Telephone Encounter (Signed)
Patient's mother states his fissure is getting worst with some blood in his underwear has been going on for at least 3 weeks. She has been getting him stool softeners but would like to know how much should she be giving him. He says his stomach hurts with a good amount of gas. He eats really fast the mother states. Please advise.

## 2020-08-11 NOTE — Telephone Encounter (Signed)
It has been 3-1/2 years since I last seen him.  He would need a new patient appointment to get evaluated please in the meanwhile suggest taking MiraLAX 1 cap daily

## 2020-08-11 NOTE — Telephone Encounter (Signed)
Patient mom verbalized understanding. She will give him Miralax till his appointment

## 2020-10-05 ENCOUNTER — Ambulatory Visit: Payer: Medicare Other | Admitting: Gastroenterology

## 2020-11-30 ENCOUNTER — Ambulatory Visit: Payer: Medicare Other | Admitting: Gastroenterology

## 2022-02-16 ENCOUNTER — Other Ambulatory Visit: Payer: Self-pay | Admitting: Internal Medicine

## 2022-02-16 DIAGNOSIS — R1011 Right upper quadrant pain: Secondary | ICD-10-CM

## 2022-02-16 DIAGNOSIS — R110 Nausea: Secondary | ICD-10-CM

## 2022-02-16 DIAGNOSIS — R1031 Right lower quadrant pain: Secondary | ICD-10-CM

## 2022-02-16 DIAGNOSIS — R109 Unspecified abdominal pain: Secondary | ICD-10-CM

## 2022-02-17 ENCOUNTER — Emergency Department
Admission: EM | Admit: 2022-02-17 | Discharge: 2022-02-17 | Disposition: A | Discharge status: Home / Self Care | Payer: Medicare HMO | Attending: Emergency Medicine | Admitting: Emergency Medicine

## 2022-02-17 ENCOUNTER — Emergency Department: Payer: Medicare HMO

## 2022-02-17 ENCOUNTER — Other Ambulatory Visit: Payer: Self-pay

## 2022-02-17 DIAGNOSIS — R1084 Generalized abdominal pain: Secondary | ICD-10-CM | POA: Diagnosis present

## 2022-02-17 DIAGNOSIS — F840 Autistic disorder: Secondary | ICD-10-CM | POA: Insufficient documentation

## 2022-02-17 LAB — URINALYSIS, ROUTINE W REFLEX MICROSCOPIC
Bilirubin Urine: NEGATIVE
Glucose, UA: NEGATIVE mg/dL
Hgb urine dipstick: NEGATIVE
Ketones, ur: NEGATIVE mg/dL
Leukocytes,Ua: NEGATIVE
Nitrite: NEGATIVE
Protein, ur: NEGATIVE mg/dL
Specific Gravity, Urine: 1.006 (ref 1.005–1.030)
pH: 7 (ref 5.0–8.0)

## 2022-02-17 LAB — COMPREHENSIVE METABOLIC PANEL
ALT: 20 U/L (ref 0–44)
AST: 22 U/L (ref 15–41)
Albumin: 4.7 g/dL (ref 3.5–5.0)
Alkaline Phosphatase: 65 U/L (ref 38–126)
Anion gap: 8 (ref 5–15)
BUN: 13 mg/dL (ref 6–20)
CO2: 27 mmol/L (ref 22–32)
Calcium: 9.3 mg/dL (ref 8.9–10.3)
Chloride: 106 mmol/L (ref 98–111)
Creatinine, Ser: 1.28 mg/dL — ABNORMAL HIGH (ref 0.61–1.24)
GFR, Estimated: >60 mL/min (ref >60)
Glucose, Bld: 111 mg/dL — ABNORMAL HIGH (ref 70–99)
Potassium: 3.7 mmol/L (ref 3.5–5.1)
Sodium: 141 mmol/L (ref 135–145)
Total Bilirubin: 0.7 mg/dL (ref 0.3–1.2)
Total Protein: 7.7 g/dL (ref 6.5–8.1)

## 2022-02-17 LAB — CBC
HCT: 47.5 % (ref 39.0–52.0)
Hemoglobin: 15.9 g/dL (ref 13.0–17.0)
MCH: 29.5 pg (ref 26.0–34.0)
MCHC: 33.5 g/dL (ref 30.0–36.0)
MCV: 88.1 fL (ref 80.0–100.0)
Platelets: 266 10*3/uL (ref 150–400)
RBC: 5.39 MIL/uL (ref 4.22–5.81)
RDW: 12.7 % (ref 11.5–15.5)
WBC: 6.6 10*3/uL (ref 4.0–10.5)
nRBC: 0 % (ref 0.0–0.2)

## 2022-02-17 LAB — LIPASE, BLOOD: Lipase: 35 U/L (ref 11–51)

## 2022-02-17 MED ORDER — LORAZEPAM 2 MG/ML IJ SOLN
2.0000 mg | Freq: Once | INTRAMUSCULAR | Status: AC
  Administered 2022-02-17: 2 mg via INTRAVENOUS
  Filled 2022-02-17: qty 1

## 2022-02-17 MED ORDER — DICYCLOMINE HCL 10 MG/ML IM SOLN
20.0000 mg | Freq: Once | INTRAMUSCULAR | Status: DC
  Filled 2022-02-17: qty 2

## 2022-02-17 MED ORDER — KETOROLAC TROMETHAMINE 15 MG/ML IJ SOLN
15.0000 mg | Freq: Once | INTRAMUSCULAR | Status: AC
  Administered 2022-02-17: 15 mg via INTRAVENOUS
  Filled 2022-02-17: qty 1

## 2022-02-17 MED ORDER — SODIUM CHLORIDE 0.9 % IV BOLUS
1000.0000 mL | Freq: Once | INTRAVENOUS | Status: AC
  Administered 2022-02-17: 1000 mL via INTRAVENOUS

## 2022-02-17 MED ORDER — DICYCLOMINE HCL 20 MG PO TABS: 20.0000 mg | ORAL_TABLET | Freq: Three times a day (TID) | ORAL | 0 refills | Status: DC

## 2022-02-17 MED ORDER — PANTOPRAZOLE SODIUM 40 MG IV SOLR
40.0000 mg | Freq: Once | INTRAVENOUS | Status: AC
  Administered 2022-02-17: 40 mg via INTRAVENOUS
  Filled 2022-02-17: qty 10

## 2022-02-17 MED ORDER — IOHEXOL 300 MG/ML  SOLN
100.0000 mL | Freq: Once | INTRAMUSCULAR | Status: AC | PRN
  Administered 2022-02-17: 100 mL via INTRAVENOUS

## 2022-02-17 MED ORDER — ONDANSETRON HCL 4 MG/2ML IJ SOLN
4.0000 mg | Freq: Once | INTRAMUSCULAR | Status: AC
  Administered 2022-02-17: 4 mg via INTRAVENOUS
  Filled 2022-02-17: qty 2

## 2022-02-17 MED ORDER — ALUMINUM-MAGNESIUM-SIMETHICONE 200-200-20 MG/5ML PO SUSP: 30.0000 mL | Freq: Three times a day (TID) | ORAL | 2 refills | Status: DC

## 2022-02-17 MED ORDER — DICYCLOMINE HCL 10 MG PO CAPS
20.0000 mg | ORAL_CAPSULE | Freq: Once | ORAL | Status: AC
  Administered 2022-02-17: 20 mg via ORAL
  Filled 2022-02-17: qty 2

## 2022-02-17 MED ORDER — ALUM & MAG HYDROXIDE-SIMETH 200-200-20 MG/5ML PO SUSP
30.0000 mL | Freq: Once | ORAL | Status: AC
  Administered 2022-02-17: 30 mL via ORAL
  Filled 2022-02-17: qty 30

## 2022-02-17 NOTE — ED Triage Notes (Signed)
PT to ED via POV from home with parents. PT has been c/o abd pain for about a week. PT parents express concerns for pain, intermittent diarrhea and decreased appetite. Pt states pain is worse after eating, 1 episodes of dry heaves. PT intermittently crying. Mother states pt has been more lethargic this week.  Was seen at [facility] earlier this week and parents were told blood work was normal , pt has an [place] scheduled for next week.  No fevers.

## 2022-02-17 NOTE — ED Provider Notes (Signed)
[facility] Provider Note    Event Date/Time   First MD Initiated Contact with Patient 02/17/22 1310     (approximate)   History   Chief Complaint: Abdominal Pain   HPI  Timothy Rose is a 36 y.o. male with a history of gastritis, autism spectrum disorder, prior appendectomy who was brought to the ED by his parents due to generalized abdominal pain for the past week.  Drinking fluids but having diarrhea, decreased appetite, decreased solid food intake.  He is also had some intermittent episodes of dry heaves.  No fever.  Seems to have lower energy level than usual.  Parents took him to his PCP who obtained lab panel which was unremarkable, scheduled an ultrasound for next week, but due to persistent pain, parents have sought evaluation in the emergency department today.   Physical Exam   Triage Vital Signs: ED Triage Vitals  Enc Vitals Group     BP 02/17/22 1235 (!) 130/100     Pulse Rate 02/17/22 1235 81     Resp 02/17/22 1235 20     Temp 02/17/22 1235 98.7 F (37.1 C)     Temp src --      SpO2 02/17/22 1235 94 %     Weight 02/17/22 1242 207 lb (93.9 kg)     Height 02/17/22 1242 5' 10"  (1.778 m)     Head Circumference --      Peak Flow --      Pain Score --      Pain Loc --      Pain Edu? --      Excl. in [place]? --     Most recent vital signs: Vitals:   02/17/22 1235  BP: (!) 130/100  Pulse: 81  Resp: 20  Temp: 98.7 F (37.1 C)  SpO2: 94%    General: Awake, no distress.  CV:  Good peripheral perfusion.  Regular rate and rhythm.  Normal distal pulses. Resp:  Normal effort.  Clear to auscultation bilaterally Abd:  No distention.  Soft with generalized tenderness.  Not focal.  No rebound rigidity or guarding.  No hernia Other:  Dry mucous membranes.  Alert and interactive.   ED Results / Procedures / Treatments   Labs (all labs ordered are listed, but only abnormal results are displayed) Labs Reviewed  COMPREHENSIVE METABOLIC  PANEL - Abnormal; Notable for the following components:      Result Value   Glucose, Bld 111 (*)    Creatinine, Ser 1.28 (*)    All other components within normal limits  URINALYSIS, ROUTINE W REFLEX MICROSCOPIC - Abnormal; Notable for the following components:   Color, Urine STRAW (*)    APPearance CLEAR (*)    All other components within normal limits  LIPASE, BLOOD  CBC     EKG    RADIOLOGY CT abdomen pelvis interpreted by me, negative for bowel obstruction or free air.   Radiology report reviewed, unremarkable   PROCEDURES:  Procedures   MEDICATIONS ORDERED IN ED: Medications  alum & mag hydroxide-simeth (MAALOX/MYLANTA) 200-200-20 MG/5ML suspension 30 mL (has no administration in time range)  dicyclomine (BENTYL) injection 20 mg (has no administration in time range)  sodium chloride 0.9 % bolus 1,000 mL (1,000 mLs Intravenous New Bag/Given 02/17/22 1340)  ondansetron (ZOFRAN) injection 4 mg (4 mg Intravenous Given 02/17/22 1341)  ketorolac (TORADOL) 15 MG/ML injection 15 mg (15 mg Intravenous Given 02/17/22 1343)  pantoprazole (PROTONIX) injection 40 mg (40 mg  Intravenous Given 02/17/22 1344)  LORazepam (ATIVAN) injection 2 mg (2 mg Intravenous Given 02/17/22 1346)  iohexol (OMNIPAQUE) 300 MG/ML solution 100 mL (100 mLs Intravenous Contrast Given 02/17/22 1506)     IMPRESSION / MDM / ASSESSMENT AND PLAN / ED COURSE  I reviewed the triage vital signs and the nursing notes.                              Differential diagnosis includes, but is not limited to, pancreatitis, biliary disease, bowel obstruction, hernia, viral syndrome, gastritis, gas bloat syndrome  Patient's presentation is most consistent with acute presentation with potential threat to life or bodily function.  Patient presents with generalized abdominal pain.  History and exam are somewhat confounded by patient's autism spectrum disorder.  Vital signs are unremarkable, he is nontoxic.  Exam is nonfocal.  Lab  panel is normal.  CT abdomen pelvis obtained which is unremarkable.  Suspect this is viral illness versus gas pain, will do Bentyl and Maalox, recommend continued follow-up with primary care, and if symptoms have not resolved over the next 2 to 3 days, make an appointment with gastroenterology.       FINAL CLINICAL IMPRESSION(S) / ED DIAGNOSES   Final diagnoses:  Generalized abdominal pain     Rx / DC Orders   ED Discharge Orders          Ordered    aluminum-magnesium hydroxide-simethicone (MAALOX) 200-200-20 MG/5ML SUSP  3 times daily before meals & bedtime        02/17/22 1623    dicyclomine (BENTYL) 20 MG tablet  3 times daily before meals & bedtime        02/17/22 1623             Note:  This document was prepared using Dragon voice recognition software and may include unintentional dictation errors.   Carrie Mew, MD 02/17/22 [phone]

## 2022-02-17 NOTE — Discharge Instructions (Signed)
Your lab tests and CT scan today were all okay.  Take Maalox every 4-6 hours and Bentyl every 6 hours for the next 3 days to control symptoms.

## 2022-02-20 ENCOUNTER — Other Ambulatory Visit: Payer: Medicare Other

## 2022-02-22 ENCOUNTER — Ambulatory Visit
Admission: RE | Admit: 2022-02-22 | Discharge: 2022-02-22 | Disposition: A | Discharge status: Home / Self Care | Payer: Medicare HMO | Source: Ambulatory Visit | Attending: Internal Medicine | Admitting: Internal Medicine

## 2022-02-22 DIAGNOSIS — R1011 Right upper quadrant pain: Secondary | ICD-10-CM | POA: Insufficient documentation

## 2022-02-22 DIAGNOSIS — R110 Nausea: Secondary | ICD-10-CM | POA: Insufficient documentation

## 2022-02-22 DIAGNOSIS — R1031 Right lower quadrant pain: Secondary | ICD-10-CM | POA: Insufficient documentation

## 2022-02-22 DIAGNOSIS — R109 Unspecified abdominal pain: Secondary | ICD-10-CM | POA: Insufficient documentation

## 2022-02-24 ENCOUNTER — Other Ambulatory Visit: Payer: Medicare Other

## 2022-05-13 ENCOUNTER — Emergency Department
Admission: EM | Admit: 2022-05-13 | Discharge: 2022-05-13 | Disposition: A | Discharge status: Home / Self Care | Payer: Medicare HMO | Attending: Emergency Medicine | Admitting: Emergency Medicine

## 2022-05-13 ENCOUNTER — Encounter: Payer: Self-pay | Admitting: Emergency Medicine

## 2022-05-13 ENCOUNTER — Emergency Department: Payer: Medicare HMO

## 2022-05-13 ENCOUNTER — Other Ambulatory Visit: Payer: Self-pay

## 2022-05-13 DIAGNOSIS — K5120 Ulcerative (chronic) proctitis without complications: Secondary | ICD-10-CM | POA: Insufficient documentation

## 2022-05-13 DIAGNOSIS — R1031 Right lower quadrant pain: Secondary | ICD-10-CM | POA: Diagnosis present

## 2022-05-13 DIAGNOSIS — K6289 Other specified diseases of anus and rectum: Secondary | ICD-10-CM

## 2022-05-13 LAB — COMPREHENSIVE METABOLIC PANEL
ALT: 22 U/L (ref 0–44)
AST: 21 U/L (ref 15–41)
Albumin: 4.3 g/dL (ref 3.5–5.0)
Alkaline Phosphatase: 73 U/L (ref 38–126)
Anion gap: 8 (ref 5–15)
BUN: 16 mg/dL (ref 6–20)
CO2: 29 mmol/L (ref 22–32)
Calcium: 9.5 mg/dL (ref 8.9–10.3)
Chloride: 105 mmol/L (ref 98–111)
Creatinine, Ser: 1.22 mg/dL (ref 0.61–1.24)
GFR, Estimated: >60 mL/min (ref >60)
Glucose, Bld: 124 mg/dL — ABNORMAL HIGH (ref 70–99)
Potassium: 3.8 mmol/L (ref 3.5–5.1)
Sodium: 142 mmol/L (ref 135–145)
Total Bilirubin: 0.6 mg/dL (ref 0.3–1.2)
Total Protein: 7.6 g/dL (ref 6.5–8.1)

## 2022-05-13 LAB — CBC
HCT: 46.4 % (ref 39.0–52.0)
Hemoglobin: 15.5 g/dL (ref 13.0–17.0)
MCH: 29.1 pg (ref 26.0–34.0)
MCHC: 33.4 g/dL (ref 30.0–36.0)
MCV: 87.1 fL (ref 80.0–100.0)
Platelets: 264 10*3/uL (ref 150–400)
RBC: 5.33 MIL/uL (ref 4.22–5.81)
RDW: 12.7 % (ref 11.5–15.5)
WBC: 7.3 10*3/uL (ref 4.0–10.5)
nRBC: 0 % (ref 0.0–0.2)

## 2022-05-13 LAB — URINALYSIS, ROUTINE W REFLEX MICROSCOPIC
Bilirubin Urine: NEGATIVE
Glucose, UA: NEGATIVE mg/dL
Hgb urine dipstick: NEGATIVE
Ketones, ur: NEGATIVE mg/dL
Leukocytes,Ua: NEGATIVE
Nitrite: NEGATIVE
Protein, ur: NEGATIVE mg/dL
Specific Gravity, Urine: 1.01 (ref 1.005–1.030)
pH: 6 (ref 5.0–8.0)

## 2022-05-13 LAB — LIPASE, BLOOD: Lipase: 32 U/L (ref 11–51)

## 2022-05-13 MED ORDER — CIPROFLOXACIN HCL 500 MG PO TABS: 500.0000 mg | ORAL_TABLET | Freq: Two times a day (BID) | ORAL | 0 refills | Status: AC

## 2022-05-13 MED ORDER — METRONIDAZOLE 500 MG PO TABS: 500.0000 mg | ORAL_TABLET | Freq: Two times a day (BID) | ORAL | 0 refills | Status: AC

## 2022-05-13 MED ORDER — IOHEXOL 300 MG/ML  SOLN
100.0000 mL | Freq: Once | INTRAMUSCULAR | Status: AC | PRN
  Administered 2022-05-13: 100 mL via INTRAVENOUS

## 2022-05-13 MED ORDER — CIPROFLOXACIN HCL 500 MG PO TABS
500.0000 mg | ORAL_TABLET | Freq: Once | ORAL | Status: AC
  Administered 2022-05-13: 500 mg via ORAL
  Filled 2022-05-13: qty 1

## 2022-05-13 NOTE — Discharge Instructions (Addendum)
Timothy Rose has been diagnosed with proctitis.   Take ciprofloxacin twice daily for 10 days. Take Flagyl twice daily for five days.  You can continue MiraLAX if constipation persist.

## 2022-05-13 NOTE — ED Triage Notes (Signed)
Pt presents to ER with RLQ pain since this morning. Pt  denies any nausea and vomiting. Pt reports diarrhea. Pt accompanied by mother.

## 2022-05-13 NOTE — ED Provider Notes (Signed)
Timothy Rose Provider Note  Patient Contact: 4:37 PM (approximate)   History   Abdominal Pain   HPI  Timothy Rose is a 36 y.o. male with a history of prior appendectomy with umbilical hernia repair presents to the emergency department with right lower quadrant abdominal pain for 1 day.  Patient has had 1 episode of vomiting without diarrhea.  No associated chest pain, chest tightness or shortness of breath.      Physical Exam   Triage Vital Signs: ED Triage Vitals  Enc Vitals Group     BP 05/13/22 1538 (!) 142/101     Pulse Rate 05/13/22 1538 84     Resp 05/13/22 1538 16     Temp 05/13/22 1538 98.1 F (36.7 C)     Temp Source 05/13/22 1538 Oral     SpO2 05/13/22 1538 95 %     Weight 05/13/22 1539 200 lb (90.7 kg)     Height 05/13/22 1539 5' 6"  (1.676 m)     Head Circumference --      Peak Flow --      Pain Score 05/13/22 1538 10     Pain Loc --      Pain Edu? --      Excl. in [place]? --     Most recent vital signs: Vitals:   05/13/22 1620 05/13/22 1839  BP: (!) 134/102 130/82  Pulse: 78 70  Resp: 16 20  Temp:  98.4 F (36.9 C)  SpO2: 93% 98%     General: Alert and in no acute distress. Eyes:  PERRL. EOMI. Head: No acute traumatic findings ENT:      Nose: No congestion/rhinnorhea.      Mouth/Throat: Mucous membranes are moist. Neck: No stridor. No cervical spine tenderness to palpation. Cardiovascular:  Good peripheral perfusion Respiratory: Normal respiratory effort without tachypnea or retractions. Lungs CTAB. Good air entry to the bases with no decreased or absent breath sounds. Gastrointestinal: Bowel sounds 4 quadrants.  Soft and tender in the right lower quadrant with no guarding.  No palpable masses. No distention. No CVA tenderness. Musculoskeletal: Full range of motion to all extremities.  Neurologic:  No gross focal neurologic deficits are appreciated.  Skin:   No rash noted Other:   ED Results / Procedures /  Treatments   Labs (all labs ordered are listed, but only abnormal results are displayed) Labs Reviewed  COMPREHENSIVE METABOLIC PANEL - Abnormal; Notable for the following components:      Result Value   Glucose, Bld 124 (*)    All other components within normal limits  URINALYSIS, ROUTINE W REFLEX MICROSCOPIC - Abnormal; Notable for the following components:   Color, Urine STRAW (*)    APPearance CLEAR (*)    All other components within normal limits  LIPASE, BLOOD  CBC        RADIOLOGY  I personally viewed and evaluated these images as part of my medical decision making, as well as reviewing the written report by the radiologist.  ED Provider Interpretation: CT abdomen pelvis findings concerning for proctocolitis   PROCEDURES:  Critical Care performed: No  Procedures   MEDICATIONS ORDERED IN ED: Medications  iohexol (OMNIPAQUE) 300 MG/ML solution 100 mL (100 mLs Intravenous Contrast Given 05/13/22 1748)  ciprofloxacin (CIPRO) tablet 500 mg (500 mg Oral Given 05/13/22 1843)     IMPRESSION / MDM / [place] / ED COURSE  I reviewed the triage vital signs and the nursing notes.  Assessment and plan Abdominal pain 36 year old male presents to the emergency department with right lower quadrant abdominal pain with vomiting for 1 day.  Vital signs were largely reassuring.  On exam, patient was tender in the right lower quadrant without guarding.  CBC, CMP, lipase and urinalysis unremarkable.  Will obtain CT abdomen pelvis and will reassess.  Working differential diagnosis at this time includes small bowel obstruction, nephrolithiasis, constipation...  CT abdomen pelvis indicates findings concerning for proctocolitis but no other acute abnormalities.  CBC, CMP and lipase reassuring.  Urinalysis shows no signs of UTI.  Patient was discharged with ciprofloxacin and Flagyl.  Return precautions were given to return with new or  worsening symptoms.     FINAL CLINICAL IMPRESSION(S) / ED DIAGNOSES   Final diagnoses:  Proctitis     Rx / DC Orders   ED Discharge Orders          Ordered    ciprofloxacin (CIPRO) 500 MG tablet  2 times daily        05/13/22 1825    metroNIDAZOLE (FLAGYL) 500 MG tablet  2 times daily        05/13/22 1827             Note:  This document was prepared using Dragon voice recognition software and may include unintentional dictation errors.   Timothy Mare [place], PA-C 05/13/22 1843    Rada Hay, MD 05/13/22 [phone]

## 2022-07-13 ENCOUNTER — Encounter: Payer: Self-pay | Admitting: Gastroenterology

## 2022-07-13 ENCOUNTER — Ambulatory Visit (INDEPENDENT_AMBULATORY_CARE_PROVIDER_SITE_OTHER): Payer: Self-pay | Admitting: Gastroenterology

## 2022-07-13 VITALS — BP 148/118 | HR 75 | Temp 97.8°F | Ht 70.0 in | Wt 200.2 lb

## 2022-07-13 DIAGNOSIS — R1084 Generalized abdominal pain: Secondary | ICD-10-CM

## 2022-07-13 NOTE — Progress Notes (Signed)
Patient already established with [facility] clinic GI, Dr. Virgina Jock.  Seen by him on 11/14.  Advised patient's father to follow-up with Dr. Virgina Jock.  I apologized to him for the inconvenience  Sherri Sear, MD

## 2022-07-20 ENCOUNTER — Other Ambulatory Visit: Payer: Self-pay | Admitting: Gastroenterology

## 2022-07-20 DIAGNOSIS — K6289 Other specified diseases of anus and rectum: Secondary | ICD-10-CM

## 2022-07-20 DIAGNOSIS — R1084 Generalized abdominal pain: Secondary | ICD-10-CM

## 2022-07-20 DIAGNOSIS — K5909 Other constipation: Secondary | ICD-10-CM

## 2022-07-31 ENCOUNTER — Ambulatory Visit
Admission: RE | Admit: 2022-07-31 | Discharge: 2022-07-31 | Disposition: A | Discharge status: Home / Self Care | Payer: Medicare HMO | Source: Ambulatory Visit | Attending: Gastroenterology | Admitting: Gastroenterology

## 2022-07-31 DIAGNOSIS — R1084 Generalized abdominal pain: Secondary | ICD-10-CM | POA: Diagnosis present

## 2022-07-31 DIAGNOSIS — K6289 Other specified diseases of anus and rectum: Secondary | ICD-10-CM | POA: Diagnosis present

## 2022-07-31 DIAGNOSIS — K5909 Other constipation: Secondary | ICD-10-CM | POA: Insufficient documentation

## 2022-07-31 MED ORDER — IOHEXOL 300 MG/ML  SOLN
100.0000 mL | Freq: Once | INTRAMUSCULAR | Status: AC | PRN
  Administered 2022-07-31: 100 mL via INTRAVENOUS

## 2022-10-22 ENCOUNTER — Other Ambulatory Visit: Payer: Self-pay

## 2022-10-22 ENCOUNTER — Emergency Department
Admission: EM | Admit: 2022-10-22 | Discharge: 2022-10-22 | Disposition: A | Discharge status: Home / Self Care | Payer: Medicare HMO | Attending: Emergency Medicine | Admitting: Emergency Medicine

## 2022-10-22 ENCOUNTER — Emergency Department: Payer: Medicare HMO

## 2022-10-22 ENCOUNTER — Encounter: Payer: Self-pay | Admitting: Emergency Medicine

## 2022-10-22 DIAGNOSIS — R1031 Right lower quadrant pain: Secondary | ICD-10-CM | POA: Diagnosis present

## 2022-10-22 DIAGNOSIS — K581 Irritable bowel syndrome with constipation: Secondary | ICD-10-CM | POA: Insufficient documentation

## 2022-10-22 DIAGNOSIS — K582 Mixed irritable bowel syndrome: Secondary | ICD-10-CM

## 2022-10-22 DIAGNOSIS — R109 Unspecified abdominal pain: Secondary | ICD-10-CM

## 2022-10-22 DIAGNOSIS — F840 Autistic disorder: Secondary | ICD-10-CM | POA: Insufficient documentation

## 2022-10-22 LAB — URINALYSIS, ROUTINE W REFLEX MICROSCOPIC
Bacteria, UA: NONE SEEN
Bilirubin Urine: NEGATIVE
Glucose, UA: NEGATIVE mg/dL
Ketones, ur: NEGATIVE mg/dL
Leukocytes,Ua: NEGATIVE
Nitrite: NEGATIVE
Protein, ur: NEGATIVE mg/dL
Specific Gravity, Urine: 1.015 (ref 1.005–1.030)
pH: 6 (ref 5.0–8.0)

## 2022-10-22 LAB — CBC
HCT: 46.7 % (ref 39.0–52.0)
Hemoglobin: 15.5 g/dL (ref 13.0–17.0)
MCH: 29.2 pg (ref 26.0–34.0)
MCHC: 33.2 g/dL (ref 30.0–36.0)
MCV: 87.9 fL (ref 80.0–100.0)
Platelets: 245 10*3/uL (ref 150–400)
RBC: 5.31 MIL/uL (ref 4.22–5.81)
RDW: 12.9 % (ref 11.5–15.5)
WBC: 6.1 10*3/uL (ref 4.0–10.5)
nRBC: 0 % (ref 0.0–0.2)

## 2022-10-22 LAB — COMPREHENSIVE METABOLIC PANEL
ALT: 18 U/L (ref 0–44)
AST: 19 U/L (ref 15–41)
Albumin: 4.1 g/dL (ref 3.5–5.0)
Alkaline Phosphatase: 67 U/L (ref 38–126)
Anion gap: 8 (ref 5–15)
BUN: 14 mg/dL (ref 6–20)
CO2: 28 mmol/L (ref 22–32)
Calcium: 9.4 mg/dL (ref 8.9–10.3)
Chloride: 104 mmol/L (ref 98–111)
Creatinine, Ser: 1.1 mg/dL (ref 0.61–1.24)
GFR, Estimated: >60 mL/min (ref >60)
Glucose, Bld: 85 mg/dL (ref 70–99)
Potassium: 4 mmol/L (ref 3.5–5.1)
Sodium: 140 mmol/L (ref 135–145)
Total Bilirubin: 0.6 mg/dL (ref 0.3–1.2)
Total Protein: 6.9 g/dL (ref 6.5–8.1)

## 2022-10-22 LAB — LIPASE, BLOOD: Lipase: 28 U/L (ref 11–51)

## 2022-10-22 MED ORDER — DICYCLOMINE HCL 10 MG PO CAPS
20.0000 mg | ORAL_CAPSULE | Freq: Once | ORAL | Status: AC
  Administered 2022-10-22: 20 mg via ORAL
  Filled 2022-10-22: qty 2

## 2022-10-22 MED ORDER — MORPHINE SULFATE (PF) 4 MG/ML IV SOLN
6.0000 mg | Freq: Once | INTRAVENOUS | Status: AC
  Administered 2022-10-22: 6 mg via INTRAVENOUS
  Filled 2022-10-22: qty 2

## 2022-10-22 MED ORDER — IOHEXOL 300 MG/ML  SOLN
100.0000 mL | Freq: Once | INTRAMUSCULAR | Status: AC | PRN
  Administered 2022-10-22: 100 mL via INTRAVENOUS

## 2022-10-22 MED ORDER — ONDANSETRON 4 MG PO TBDP: 4.0000 mg | ORAL_TABLET | Freq: Three times a day (TID) | ORAL | 0 refills | Status: DC | PRN

## 2022-10-22 MED ORDER — ONDANSETRON HCL 4 MG/2ML IJ SOLN
4.0000 mg | Freq: Once | INTRAMUSCULAR | Status: AC
  Administered 2022-10-22: 4 mg via INTRAVENOUS
  Filled 2022-10-22: qty 2

## 2022-10-22 MED ORDER — DICYCLOMINE HCL 20 MG PO TABS: 20.0000 mg | ORAL_TABLET | Freq: Three times a day (TID) | ORAL | 0 refills | Status: DC

## 2022-10-22 NOTE — ED Triage Notes (Signed)
Pt via POV from home. Pt c/o RLQ abd pain for the past couple of months, states pain is intermittent. States they came in today because pain is worse this AM. Pt has a colonoscopy scheduled in May. Pt has a hx of appendectomy and hernia repair. Pt has a hx of autism and schizophrenia. Pt is A&Ox4 and NAD

## 2022-10-22 NOTE — ED Provider Notes (Signed)
[facility] Provider Note    Event Date/Time   First MD Initiated Contact with Patient 10/22/22 0957     (approximate)   History   Abdominal Pain   HPI  Timothy Rose is a 37 y.o. male  here with abdominal pain. History is somewhat limited 2/2 cognitive impairment. However, history provided with pt and pt's mother. Per report, pt has had progressive, significant pain in R abdomen "for months." He has had a fairly extensive medical work-up for this including GI referrals. He has been told he needs to keep his BMs normal and he has been on a bowel regimen. Over the last few days he's been c/o more pain in his R abdomen and was fairly severe overnight, so mother brought him to ED. He tells me it continues to hurt but also answers most questions with "yes."          Physical Exam   Triage Vital Signs: ED Triage Vitals  Enc Vitals Group     BP 10/22/22 0934 121/86     Pulse Rate 10/22/22 0934 75     Resp 10/22/22 0934 17     Temp 10/22/22 0934 98.4 F (36.9 C)     Temp Source 10/22/22 0934 Oral     SpO2 10/22/22 0934 96 %     Weight 10/22/22 0923 180 lb (81.6 kg)     Height 10/22/22 0923 5\' 9"  (1.753 m)     Head Circumference --      Peak Flow --      Pain Score 10/22/22 0923 10     Pain Loc --      Pain Edu? --      Excl. in GC? --     Most recent vital signs: Vitals:   10/22/22 0934 10/22/22 1126  BP: 121/86 123/84  Pulse: 75 60  Resp: 17 16  Temp: 98.4 F (36.9 C) 98.3 F (36.8 C)  SpO2: 96% 98%     General: Awake, no distress.  CV:  Good peripheral perfusion. RRR. Resp:  Normal work of breathing. Lungs CTAB. No w/r/r. Abd:  No distention. Moderate RUQ, RLQ TTP, no rebound or guarding. Other:  Moist MM.   ED Results / Procedures / Treatments   Labs (all labs ordered are listed, but only abnormal results are displayed) Labs Reviewed  URINALYSIS, ROUTINE W REFLEX MICROSCOPIC - Abnormal; Notable for the following components:       Result Value   Color, Urine YELLOW (*)    APPearance CLEAR (*)    Hgb urine dipstick SMALL (*)    All other components within normal limits  LIPASE, BLOOD  COMPREHENSIVE METABOLIC PANEL  CBC     EKG    RADIOLOGY CT A/P: Negative, no acute abnormality   I also independently reviewed and agree with radiologist interpretations.   PROCEDURES:  Critical Care performed: No   MEDICATIONS ORDERED IN ED: Medications  ondansetron (ZOFRAN) injection 4 mg (4 mg Intravenous Given 10/22/22 1059)  morphine (PF) 4 MG/ML injection 6 mg (6 mg Intravenous Given 10/22/22 1059)  iohexol (OMNIPAQUE) 300 MG/ML solution 100 mL (100 mLs Intravenous Contrast Given 10/22/22 1111)  dicyclomine (BENTYL) capsule 20 mg (20 mg Oral Given 10/22/22 1157)     IMPRESSION / MDM / ASSESSMENT AND PLAN / ED COURSE  I reviewed the triage vital signs and the nursing notes.  Differential diagnosis includes, but is not limited to, chronic abd pain, functional abd pain, IBS, cholecystitis, appendicitis, obstruction, renal colic, UTI  Patient's presentation is most consistent with acute presentation with potential threat to life or bodily function.  The patient is on the cardiac monitor to evaluate for evidence of arrhythmia and/or significant heart rate changes  37 year old male here with recurrent right-sided abdominal pain.  The patient has a well-documented, chronic history of the same.  He has had extensive GI workup.  Today, lab work and imaging is very reassuring.  CT abdomen and pelvis obtained and shows no acute abnormality.  Urinalysis negative.  CBC without leukocytosis or anemia.  CMP unremarkable with normal renal function and LFTs.  Lipase is normal.  He feels better with analgesia and Bentyl.  Of note, symptoms seem to correlate with changes in bowel habits, relate with stressors, and I suspect he could have a component of IBS.  Will trial Bentyl.  Otherwise, no apparent  emergent pathology.  Refer back to his GI follow-up.   FINAL CLINICAL IMPRESSION(S) / ED DIAGNOSES   Final diagnoses:  Abdominal pain, unspecified abdominal location  Irritable bowel syndrome with both constipation and diarrhea     Rx / DC Orders   ED Discharge Orders          Ordered    dicyclomine (BENTYL) 20 MG tablet  3 times daily before meals & bedtime        10/22/22 1314             Note:  This document was prepared using Dragon voice recognition software and may include unintentional dictation errors.   Shaune Pollack, MD 10/22/22 1314

## 2022-10-25 ENCOUNTER — Ambulatory Visit: Payer: Medicare HMO

## 2022-10-25 DIAGNOSIS — R197 Diarrhea, unspecified: Secondary | ICD-10-CM

## 2022-10-25 DIAGNOSIS — R110 Nausea: Secondary | ICD-10-CM

## 2022-10-25 DIAGNOSIS — R1084 Generalized abdominal pain: Secondary | ICD-10-CM

## 2022-10-25 DIAGNOSIS — K589 Irritable bowel syndrome without diarrhea: Secondary | ICD-10-CM

## 2022-11-20 ENCOUNTER — Ambulatory Visit: Admit: 2022-11-20 | Payer: Medicare HMO | Admitting: Gastroenterology

## 2022-11-20 SURGERY — COLONOSCOPY
Anesthesia: General

## 2023-01-15 ENCOUNTER — Ambulatory Visit (INDEPENDENT_AMBULATORY_CARE_PROVIDER_SITE_OTHER): Payer: Medicare HMO | Admitting: Nurse Practitioner

## 2023-01-15 ENCOUNTER — Encounter: Payer: Self-pay | Admitting: Nurse Practitioner

## 2023-01-15 VITALS — BP 122/81 | HR 80 | Temp 98.7°F | Ht 69.5 in | Wt 189.8 lb

## 2023-01-15 DIAGNOSIS — Z7689 Persons encountering health services in other specified circumstances: Secondary | ICD-10-CM | POA: Diagnosis not present

## 2023-01-15 DIAGNOSIS — F840 Autistic disorder: Secondary | ICD-10-CM

## 2023-01-15 DIAGNOSIS — F419 Anxiety disorder, unspecified: Secondary | ICD-10-CM | POA: Diagnosis not present

## 2023-01-15 DIAGNOSIS — R1084 Generalized abdominal pain: Secondary | ICD-10-CM

## 2023-01-15 NOTE — Progress Notes (Signed)
BP 122/81 (BP Location: Left Arm, Cuff Size: Normal)   Pulse 80   Temp 98.7 F (37.1 C) (Oral)   Ht 5' 9.5" (1.765 m)   Wt 189 lb 12.8 oz (86.1 kg)   SpO2 98%   BMI 27.63 kg/m    Subjective:    Patient ID: Timothy Rose, male    DOB: February 13, 1986, 37 y.o.   MRN: [id number]  HPI: CHETT NISHIYAMA is a 37 y.o. male  Chief Complaint  Patient presents with   Autism   Patient presents to clinic to establish care with new PCP.  Introduced to Publishing rights manager role and practice setting.  All questions answered.  Discussed provider/patient relationship and expectations.  Patient reports a history of Autism, anxiety, headaches, and abdominal pain.  Has been seeing Dr. Arlana Pouch in [place] but they no longer take Medicare and Medicaid.  Patient will be seeing at [facility]. Patient has had abdominal pain for several years.  He has had colonoscopies and [place].  They can't find anything wrong.  They feel like it is more of a mental issue.  He does have gas pills that he takes when it flares up.  At times, it increases his irritability.     Patient denies a history of: Hypertension, Elevated Cholesterol, Diabetes, Thyroid problems, Depression, Neurological problems.  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   Head injury 06/12/2016   Gastritis without bleeding    Gastric polyp    Autism 01/15/2023   Anxiety 01/15/2023   Resolved Ambulatory Problems    Diagnosis Date Noted   No Resolved Ambulatory Problems   Past Medical History:  Diagnosis Date   Schizophrenia (HCC)    Seizures (HCC)    Past Surgical History:  Procedure Laterality Date   APPENDECTOMY     COLONOSCOPY WITH PROPOFOL N/A 02/08/2017   Procedure: COLONOSCOPY WITH PROPOFOL;  Surgeon: Wyline Mood, MD;  Location: [facility] ENDOSCOPY;  Service:  Gastroenterology;  Laterality: N/A;   cranial surgery     ESOPHAGOGASTRODUODENOSCOPY (EGD) WITH PROPOFOL N/A 08/10/2016   Procedure: ESOPHAGOGASTRODUODENOSCOPY (EGD) WITH PROPOFOL;  Surgeon: Wyline Mood, MD;  Location: [facility] ENDOSCOPY;  Service: Endoscopy;  Laterality: N/A;   EUS N/A 03/08/2017   Procedure: FULL UPPER ENDOSCOPIC ULTRASOUND (EUS) RADIAL;  Surgeon: Rayann Heman, MD;  Location: [facility] ENDOSCOPY;  Service: Endoscopy;  Laterality: N/A;   INGUINAL HERNIA REPAIR Left    LAPAROSCOPIC APPENDECTOMY N/A 02/18/2015   Procedure: Laparoscopic appendectomy, umbilical hernia repair;  Surgeon: Natale Lay, MD;  Location: [facility] ORS;  Service: General;  Laterality: N/A;   pyloric stenosis     SURGERY SCROTAL / TESTICULAR     TYMPANOSTOMY TUBE PLACEMENT     Family History  Problem Relation Age of Onset   Diabetes Mother    Hypertension Father    Diabetes Brother      Review of Systems  Gastrointestinal:  Positive for abdominal pain.  Psychiatric/Behavioral:  The patient is nervous/anxious.     Per HPI unless specifically indicated above     Objective:    BP 122/81 (BP Location: Left Arm, Cuff Size: Normal)   Pulse 80   Temp 98.7 F (37.1 C) (Oral)   Ht 5' 9.5" (1.765 m)   Wt 189 lb 12.8 oz (86.1 kg)   SpO2 98%   BMI 27.63 kg/m   Wt Readings from  Last 3 Encounters:  01/15/23 189 lb 12.8 oz (86.1 kg)  10/22/22 180 lb (81.6 kg)  07/13/22 200 lb 3.2 oz (90.8 kg)    Physical Exam Vitals and nursing note reviewed.  Constitutional:      General: He is not in acute distress.    Appearance: Normal appearance. He is not ill-appearing, toxic-appearing or diaphoretic.  HENT:     Head: Normocephalic.     Right Ear: External ear normal.     Left Ear: External ear normal.     Nose: Nose normal. No congestion or rhinorrhea.     Mouth/Throat:     Mouth: Mucous membranes are moist.  Eyes:     General:        Right eye: No discharge.        Left eye: No discharge.     Extraocular  Movements: Extraocular movements intact.     Conjunctiva/sclera: Conjunctivae normal.     Pupils: Pupils are equal, round, and reactive to light.  Cardiovascular:     Rate and Rhythm: Normal rate and regular rhythm.     Heart sounds: No murmur heard. Pulmonary:     Effort: Pulmonary effort is normal. No respiratory distress.     Breath sounds: Normal breath sounds. No wheezing, rhonchi or rales.  Abdominal:     General: Abdomen is flat. Bowel sounds are normal.  Musculoskeletal:     Cervical back: Normal range of motion and neck supple.  Skin:    General: Skin is warm and dry.     Capillary Refill: Capillary refill takes less than 2 seconds.  Neurological:     General: No focal deficit present.     Mental Status: He is alert and oriented to person, place, and time.  Psychiatric:        Mood and Affect: Mood normal.        Behavior: Behavior normal.        Thought Content: Thought content normal.        Judgment: Judgment normal.     Results for orders placed or performed during the hospital encounter of 10/22/22  Lipase, blood  Result Value Ref Range   Lipase 28 11 - 51 U/L  Comprehensive metabolic panel  Result Value Ref Range   Sodium 140 135 - 145 mmol/L   Potassium 4.0 3.5 - 5.1 mmol/L   Chloride 104 98 - 111 mmol/L   CO2 28 22 - 32 mmol/L   Glucose, Bld 85 70 - 99 mg/dL   BUN 14 6 - 20 mg/dL   Creatinine, Ser 1.61 0.61 - 1.24 mg/dL   Calcium 9.4 8.9 - 09.6 mg/dL   Total Protein 6.9 6.5 - 8.1 g/dL   Albumin 4.1 3.5 - 5.0 g/dL   AST 19 15 - 41 U/L   ALT 18 0 - 44 U/L   Alkaline Phosphatase 67 38 - 126 U/L   Total Bilirubin 0.6 0.3 - 1.2 mg/dL   GFR, Estimated >04 >54 mL/min   Anion gap 8 5 - 15  CBC  Result Value Ref Range   WBC 6.1 4.0 - 10.5 K/uL   RBC 5.31 4.22 - 5.81 MIL/uL   Hemoglobin 15.5 13.0 - 17.0 g/dL   HCT 09.8 11.9 - 14.7 %   MCV 87.9 80.0 - 100.0 fL   MCH 29.2 26.0 - 34.0 pg   MCHC 33.2 30.0 - 36.0 g/dL   RDW 82.9 56.2 - 13.0 %   Platelets  245 150 - 400  K/uL   nRBC 0.0 0.0 - 0.2 %  Urinalysis, Routine w reflex microscopic -Urine, Clean Catch  Result Value Ref Range   Color, Urine YELLOW (A) YELLOW   APPearance CLEAR (A) CLEAR   Specific Gravity, Urine 1.015 1.005 - 1.030   pH 6.0 5.0 - 8.0   Glucose, UA NEGATIVE NEGATIVE mg/dL   Hgb urine dipstick SMALL (A) NEGATIVE   Bilirubin Urine NEGATIVE NEGATIVE   Ketones, ur NEGATIVE NEGATIVE mg/dL   Protein, ur NEGATIVE NEGATIVE mg/dL   Nitrite NEGATIVE NEGATIVE   Leukocytes,Ua NEGATIVE NEGATIVE   WBC, UA 0-5 0 - 5 WBC/hpf   Bacteria, UA NONE SEEN NONE SEEN   Squamous Epithelial / HPF 0-5 0 - 5 /HPF   Mucus PRESENT       Assessment & Plan:   Problem List Items Addressed This Visit       Other   Autism - Primary    Chronic.  Followed by [facility] whom he will continue to follow with.  Did not bring medications with him today.  Will need to verify medications at next visit.  Discussed with Dad to bring medications to next visit.  Follow up in 1 month.        Anxiety    Chronic.  Followed by [facility] whom he will continue to follow with.  Does have a lot of anxiety around his health but seems to be doing better lately.  Did not bring medications with him today.  Will need to verify medications at next visit.  Discussed with Dad to bring medications to next visit.  Follow up in 1 month.        Other Visit Diagnoses     Generalized abdominal pain       Followed by GI- unable to determine the cause but does follow with GI.  Continue to follow per their recommendation.   Encounter to establish care            Follow up plan: Return in about 1 month (around 02/15/2023) for Physical and Fasting labs and Medicare wellness.

## 2023-01-15 NOTE — Assessment & Plan Note (Signed)
Chronic.  Followed by [facility] whom he will continue to follow with.  Does have a lot of anxiety around his health but seems to be doing better lately.  Did not bring medications with him today.  Will need to verify medications at next visit.  Discussed with Dad to bring medications to next visit.  Follow up in 1 month.

## 2023-01-15 NOTE — Assessment & Plan Note (Signed)
Chronic.  Followed by [facility] whom he will continue to follow with.  Did not bring medications with him today.  Will need to verify medications at next visit.  Discussed with Dad to bring medications to next visit.  Follow up in 1 month.

## 2023-01-16 ENCOUNTER — Telehealth: Payer: Self-pay | Admitting: Nurse Practitioner

## 2023-01-16 NOTE — Telephone Encounter (Signed)
Copied from CRM [id number]. Topic: General - Other >> Jan 16, 2023 10:59 AM Macon Large wrote: Reason for CRM: Pt dad stated he had a missed call from the office so he was returning the call. Pt dad stated the best way to reach him is by e-mail.

## 2023-02-19 ENCOUNTER — Ambulatory Visit (INDEPENDENT_AMBULATORY_CARE_PROVIDER_SITE_OTHER): Payer: Medicare HMO | Admitting: Physician Assistant

## 2023-02-19 ENCOUNTER — Encounter: Payer: Self-pay | Admitting: Physician Assistant

## 2023-02-19 VITALS — BP 111/79 | HR 71 | Ht 69.0 in | Wt 181.2 lb

## 2023-02-19 DIAGNOSIS — Z0000 Encounter for general adult medical examination without abnormal findings: Secondary | ICD-10-CM

## 2023-02-19 DIAGNOSIS — Z23 Encounter for immunization: Secondary | ICD-10-CM | POA: Diagnosis not present

## 2023-02-19 DIAGNOSIS — R1084 Generalized abdominal pain: Secondary | ICD-10-CM

## 2023-02-19 LAB — URINALYSIS, ROUTINE W REFLEX MICROSCOPIC
Bilirubin, UA: NEGATIVE
Glucose, UA: NEGATIVE
Ketones, UA: NEGATIVE
Leukocytes,UA: NEGATIVE
Nitrite, UA: NEGATIVE
Protein,UA: NEGATIVE
RBC, UA: NEGATIVE
Specific Gravity, UA: 1.015 (ref 1.005–1.030)
Urobilinogen, Ur: 0.2 mg/dL (ref 0.2–1.0)
pH, UA: 7 (ref 5.0–7.5)

## 2023-02-19 NOTE — Progress Notes (Signed)
Annual Physical Exam   Name: Timothy Rose   MRN: [id number]    DOB: 1985-10-06   Date:02/19/2023  Today's Provider: Jacquelin Hawking, MHS, PA-C Introduced myself to the patient as a PA-C and provided education on APPs in clinical practice.         Subjective  Chief Complaint  Chief Complaint  Patient presents with   Annual Exam   GI Problem    Patient says his stomach has been bothering him for the past 10 months. Patient father states the patient has a fissure. Patient has had a colonoscopy back in March. Patient says it hurts him really bad when he has a bowel movements. Patient is noticing some blood in stools.    Constipation   Flank Pain    HPI  Patient is here with his father who is assisting with HPI   Patient presents for annual CPE .  Diet: He eats fish, vegetables,  Exercise: He states he walks regularly  Sleep:"I sleep really good" Getting about 12 hours per night of sleep.  Mood: He states he is a bit nervous today     Depression: phq 9 is negative    02/19/2023    9:10 AM 01/15/2023    9:52 AM  Depression screen PHQ 2/9  Decreased Interest 1 1  Down, Depressed, Hopeless 1 1  PHQ - 2 Score 2 2  Altered sleeping 1 1  Tired, decreased energy 0 1  Change in appetite 0 1  Feeling bad or failure about yourself  1 0  Trouble concentrating 0 1  Moving slowly or fidgety/restless 0 0  Suicidal thoughts 0 0  PHQ-9 Score 4 6  Difficult doing work/chores  Somewhat difficult    Constipation and abdominal pain   He reports he is having stomach problems  His father states this has been ongoing for about 10 months  He states it is hurting on the right side and cramping He has had colonoscopy this year but his father states it was negative  Reviewed imaging and procedure results  He has tried rounds of stool softeners and laxatives in the past but this led to issues with diarrhea     Hypertension:  BP Readings from Last 3 Encounters:  02/19/23 111/79   01/15/23 122/81  10/22/22 115/83    Obesity: Wt Readings from Last 3 Encounters:  02/19/23 181 lb 3.2 oz (82.2 kg)  01/15/23 189 lb 12.8 oz (86.1 kg)  10/22/22 180 lb (81.6 kg)   BMI Readings from Last 3 Encounters:  02/19/23 26.76 kg/m  01/15/23 27.63 kg/m  10/22/22 26.58 kg/m     Lipids:  No results found for: "CHOL" No results found for: "HDL" No results found for: "LDLCALC" No results found for: "TRIG" No results found for: "CHOLHDL" No results found for: "LDLDIRECT" Glucose:  Glucose  Date Value Ref Range Status  11/06/2011 165 (H) 65 - 99 mg/dL Final   Glucose, Bld  Date Value Ref Range Status  10/22/2022 85 70 - 99 mg/dL Final    Comment:    Glucose reference range applies only to samples taken after fasting for at least 8 hours.  05/13/2022 124 (H) 70 - 99 mg/dL Final    Comment:    Glucose reference range applies only to samples taken after fasting for at least 8 hours.  02/17/2022 111 (H) 70 - 99 mg/dL Final    Comment:    Glucose reference range applies only to  samples taken after fasting for at least 8 hours.      Single STD testing and prevention (HIV/chl/gon/syphilis):  not applicable HIV screening: due today Hep C Screening: due today  Skin cancer: Discussed monitoring for atypical lesions Colorectal cancer screening: He has had colonoscopy for diagnostic purposes for abdominal pain  Prostate cancer screening:  not applicable No results found for: "PSA"   Lung cancer:  Low Dose CT Chest recommended if Age 56-80 years, 30 pack-year currently smoking OR have quit w/in 15years. Patient  not applicable AAA: The USPSTF recommends one-time screening with ultrasonography in men ages 6 to 75 years who have ever smoked. 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    Past Surgical History:  Procedure Laterality Date   APPENDECTOMY     COLONOSCOPY WITH PROPOFOL N/A 02/08/2017   Procedure: COLONOSCOPY WITH PROPOFOL;  Surgeon: Wyline Mood, MD;  Location: [facility] ENDOSCOPY;  Service: Gastroenterology;  Laterality: N/A;   cranial surgery     ESOPHAGOGASTRODUODENOSCOPY (EGD) WITH PROPOFOL N/A 08/10/2016   Procedure: ESOPHAGOGASTRODUODENOSCOPY (EGD) WITH PROPOFOL;  Surgeon: Wyline Mood, MD;  Location: [facility] ENDOSCOPY;  Service: Endoscopy;  Laterality: N/A;   EUS N/A 03/08/2017   Procedure: FULL UPPER ENDOSCOPIC ULTRASOUND (EUS) RADIAL;  Surgeon: Rayann Heman, MD;  Location: [facility] ENDOSCOPY;  Service: Endoscopy;  Laterality: N/A;   INGUINAL HERNIA REPAIR Left    LAPAROSCOPIC APPENDECTOMY N/A 02/18/2015   Procedure: Laparoscopic appendectomy, umbilical hernia repair;  Surgeon: Natale Lay, MD;  Location: [facility] ORS;  Service: General;  Laterality: N/A;   pyloric stenosis     SURGERY SCROTAL / TESTICULAR     TYMPANOSTOMY TUBE PLACEMENT      Family History  Problem Relation Age of Onset   Diabetes Mother    Hypertension Father    Diabetes Brother     Social History   Socioeconomic History   Marital status: Single    Spouse name: Not on file   Number of children: Not on file   Years of education: Not on file   Highest education level: Not on file  Occupational History   Not on file  Tobacco Use   Smoking status: Never   Smokeless tobacco: Never  Vaping Use   Vaping status: Never Used  Substance and Sexual Activity   Alcohol use: No   Drug use: No    Sexual activity: Not on file  Other Topics Concern   Not on file  Social History Narrative   Not on file   Social Determinants of Health   Financial Resource Strain: Not on file  Food Insecurity: Not on file  Transportation Needs: Not on file  Physical Activity: Not on file  Stress: Not on file  Social Connections: Not on file  Intimate Partner Violence: Not on file     Current Outpatient Medications:    ARIPiprazole (ABILIFY) 2 MG tablet, Take 1 tablet by mouth daily., Disp: ,  Rfl:    azelastine (ASTELIN) 0.1 % nasal spray, , Disp: , Rfl:    dexlansoprazole (DEXILANT) 60 MG capsule, Take 1 capsule (60 mg total) by mouth daily., Disp: 30 capsule, Rfl: 3   diazepam (VALIUM) 10 MG tablet, Take 10 mg by mouth 3 (three) times daily., Disp: , Rfl:    fluticasone (FLONASE) 50 MCG/ACT nasal spray, , Disp: , Rfl:    Melatonin 10 MG TABS, Take 10 mg by mouth at bedtime as needed., Disp: , Rfl:    naproxen sodium (ANAPROX) 220 MG tablet, Take 110 mg by mouth 2 (two) times daily with a meal., Disp: , Rfl:    ondansetron (ZOFRAN-ODT) 4 MG disintegrating tablet, Take 1 tablet (4 mg total) by mouth every 8 (eight) hours as needed for nausea or vomiting., Disp: 20 tablet, Rfl: 0   Paliperidone (INVEGA) 1.5 MG TB24, Take 1.5 mg by mouth at bedtime., Disp: , Rfl:    QUEtiapine (SEROQUEL) 25 MG tablet, Take 25 mg by mouth at bedtime., Disp: , Rfl:    sertraline (ZOLOFT) 100 MG tablet, TAKE 1 TABLET BY MOUTH DAILY. TAKE ALONG WITH 50 MG TABLET FOR A TOTAL DOSE OF 150 MG EVERY MORNING., Disp: , Rfl:    PARoxetine (PAXIL-CR) 12.5 MG 24 hr tablet, Take 12.5 mg by mouth daily., Disp: , Rfl:    polyethylene glycol (MIRALAX / GLYCOLAX) packet, Take 17 g by mouth daily as needed. (Patient not taking: Reported on 02/19/2023), Disp: , Rfl:    ranitidine (ZANTAC) 150 MG tablet, Take 1 tablet (150 mg total) by mouth 2 (two) times daily., Disp: 60 tablet, Rfl: 2  Allergies  Allergen Reactions   Sulfa Antibiotics  Anaphylaxis and Hives   Haldol [Haloperidol] Swelling   Viibryd [Vilazodone Hcl] Other (See Comments)    Gets aggressive      Review of Systems  Constitutional:  Negative for chills, fever, malaise/fatigue and weight loss.  HENT:  Negative for hearing loss, nosebleeds, sore throat and tinnitus.   Eyes:  Negative for blurred vision, double vision and photophobia.  Respiratory:  Negative for cough, shortness of breath and wheezing.   Cardiovascular:  Negative for chest pain, palpitations and leg swelling.  Gastrointestinal:  Positive for abdominal pain, blood in stool and constipation. Negative for diarrhea, melena, nausea and vomiting.  Musculoskeletal:  Negative for falls, joint pain and myalgias.  Skin:  Negative for itching and rash.  Neurological:  Negative for dizziness, tingling, tremors, loss of consciousness and headaches.       Objective  Vitals:   02/19/23 0903  BP: 111/79  Pulse: 71  SpO2: 99%  Weight: 181 lb 3.2 oz (82.2 kg)  Height: 5\' 9"  (1.753 m)    Body mass index is 26.76 kg/m.  Physical Exam Vitals reviewed.  Constitutional:      General: He is awake.     Appearance: Normal appearance. He is well-developed and well-groomed.  HENT:     Head: Normocephalic and atraumatic.     Right Ear: Hearing, tympanic membrane and ear canal normal.     Left Ear: Hearing, tympanic membrane and ear canal normal.     Mouth/Throat:     Lips: Pink.     Mouth: Mucous membranes are moist.     Pharynx: Oropharynx is clear. Uvula midline. No pharyngeal swelling, oropharyngeal exudate or posterior oropharyngeal erythema.     Tonsils: No tonsillar exudate or tonsillar abscesses.  Eyes:     General: Lids are normal. Gaze aligned appropriately.  Extraocular Movements: Extraocular movements intact.     Right eye: Normal extraocular motion and no nystagmus.     Left eye: Normal extraocular motion and no nystagmus.     Conjunctiva/sclera: Conjunctivae normal.     Pupils:  Pupils are equal, round, and reactive to light.  Neck:     Thyroid: No thyroid mass, thyromegaly or thyroid tenderness.  Cardiovascular:     Rate and Rhythm: Normal rate and regular rhythm.     Pulses: Normal pulses.          Radial pulses are 2+ on the right side and 2+ on the left side.     Heart sounds: Normal heart sounds. No murmur heard.    No friction rub. No gallop.  Pulmonary:     Effort: Pulmonary effort is normal.     Breath sounds: Normal breath sounds. No decreased air movement. No decreased breath sounds, wheezing, rhonchi or rales.  Abdominal:     General: Abdomen is flat. Bowel sounds are normal.     Palpations: Abdomen is soft.     Tenderness: There is no abdominal tenderness.  Musculoskeletal:     Cervical back: Normal range of motion and neck supple.     Right lower leg: No edema.     Left lower leg: No edema.  Lymphadenopathy:     Head:     Right side of head: No submental, submandibular or preauricular adenopathy.     Left side of head: No submental, submandibular or preauricular adenopathy.     Cervical:     Right cervical: No superficial or posterior cervical adenopathy.    Left cervical: No superficial or posterior cervical adenopathy.     Upper Body:     Right upper body: No supraclavicular adenopathy.     Left upper body: No supraclavicular adenopathy.  Neurological:     General: No focal deficit present.     Mental Status: He is alert and oriented to person, place, and time.     GCS: GCS eye subscore is 4. GCS verbal subscore is 5. GCS motor subscore is 6.     Cranial Nerves: Dysarthria present.     Motor: No weakness or tremor.  Psychiatric:        Attention and Perception: Attention and perception normal.        Mood and Affect: Affect normal. Mood is anxious.        Speech: Speech is slurred.        Behavior: Behavior normal. Behavior is cooperative.        Thought Content: Thought content normal.      Recent Results (from the past 2160  hour(s))  Urinalysis, Routine w reflex microscopic     Status: None   Collection Time: 02/19/23  9:50 AM  Result Value Ref Range   Specific Gravity, UA 1.015 1.005 - 1.030   pH, UA 7.0 5.0 - 7.5   Color, UA Yellow Yellow   Appearance Ur Clear Clear   Leukocytes,UA Negative Negative   Protein,UA Negative Negative/Trace   Glucose, UA Negative Negative   Ketones, UA Negative Negative   RBC, UA Negative Negative   Bilirubin, UA Negative Negative   Urobilinogen, Ur 0.2 0.2 - 1.0 mg/dL   Nitrite, UA Negative Negative   Microscopic Examination Comment     Comment: Microscopic not indicated and not performed.     Fall Risk:    02/19/2023    9:09 AM 01/15/2023    9:45 AM  Fall  Risk   Falls in the past year? 0 0  Number falls in past yr: 0 0  Injury with Fall? 0 0  Risk for fall due to : No Fall Risks No Fall Risks  Follow up Falls evaluation completed Falls evaluation completed     Functional Status Survey:      Assessment & Plan  Problem List Items Addressed This Visit   None Visit Diagnoses     Annual physical exam    -  Primary  -Prostate cancer screening and PSA options (with potential risks and benefits of testing vs not testing) were discussed along with recent recs/guidelines. -USPSTF grade A and B recommendations reviewed with patient; age-appropriate recommendations, preventive care, screening tests, etc discussed and encouraged; healthy living encouraged; see AVS for patient education given to patient -Discussed importance of 150 minutes of physical activity weekly, eat two servings of fish weekly, eat one serving of tree nuts ( cashews, pistachios, pecans, almonds.Marland Kitchen) every other day, eat 6 servings of fruit/vegetables daily and drink plenty of water and avoid sweet beverages.  -Reviewed Health Maintenance: yes    Relevant Orders   Tdap vaccine greater than or equal to 7yo IM (Completed)   Comp Met (CMET)   CBC w/Diff   Lipid Profile   HgB A1c   TSH    Urinalysis, Routine w reflex microscopic (Completed)   Need for tetanus, diphtheria, and acellular pertussis (Tdap) vaccine       Relevant Orders   Tdap vaccine greater than or equal to 7yo IM (Completed)   Generalized abdominal pain     Chronic per patient and father Reports ongoing issues with abdominal pain and constipation for the past 10 months He is followed by GI and has had Endoscopy and Colonoscopy along with contrast imaging for evaluation Recommend Increasing fiber intake and using stool softeners for now to assist with stool passage Recommend continued follow up with GI as needed for persistent or progressing symptoms  Reviewed using preparation H and witch hazel pads for rectal irritation and hemorrhoids/fissure Follow up as needed for persistent or progressing symptoms          Return in about 6 months (around 08/22/2023) for autism, stomach concerns.   I, Jameah Rouser E Irbin Fines, PA-C, have reviewed all documentation for this visit. The documentation on 02/19/23 for the exam, diagnosis, procedures, and orders are all accurate and complete.   Jacquelin Hawking, MHS, PA-C [facility]

## 2023-02-22 ENCOUNTER — Telehealth: Payer: Self-pay | Admitting: Nurse Practitioner

## 2023-02-22 NOTE — Telephone Encounter (Signed)
Copied from CRM [id number]. Topic: Medicare AWV >> Feb 22, 2023 11:17 AM Payton Doughty wrote: Reason for CRM: Called 02/22/2023 to sched AWV - MAILBOX FULL  Verlee Rossetti; Care Guide Ambulatory Clinical Support [place] [facility] Direct Dial: [phone]

## 2023-02-23 NOTE — Progress Notes (Signed)
Your labs are back.  Your electrolytes, liver and kidney function are in normal limits Your cholesterol is a bit elevated. I recommend that you try to reduce your consumption of saturated fats and try to exercise to help get this under better control.  Your A1c was 6.0 which is in prediabetic range. No medications are indicated at this time but I do recommend reducing your intake of excess sugars and carbs to prevent progression to diabetes. Your CBC, thyroid testing, and urine testing were all normal Please let us know if you have further questions or concerns.

## 2023-08-22 ENCOUNTER — Ambulatory Visit: Payer: Medicare HMO | Admitting: Nurse Practitioner

## 2023-08-22 ENCOUNTER — Encounter: Payer: Self-pay | Admitting: Nurse Practitioner

## 2023-08-22 VITALS — BP 133/86 | HR 64 | Ht 69.0 in | Wt 176.2 lb

## 2023-08-22 DIAGNOSIS — R1084 Generalized abdominal pain: Secondary | ICD-10-CM

## 2023-08-22 NOTE — Progress Notes (Signed)
 BP 133/86 (BP Location: Right Arm, Patient Position: Sitting, Cuff Size: Large)   Pulse 64   Ht 5' 9 (1.753 m)   Wt 176 lb 3.2 oz (79.9 kg)   SpO2 100%   BMI 26.02 kg/m    Subjective:    Patient ID: Timothy Rose, male    DOB: 14-May-1986, 38 y.o.   MRN: [id number]  HPI: Timothy Rose is a 38 y.o. male  Chief Complaint  Patient presents with   GI Problem    Right side, has been hurting for awhile , has been to the GI doctor    ABDOMINAL ISSUES Patient has intermittent pain that GI feels like is a psychosomatic pain.  He had a Colonoscopy that was unremarkable.  Dad feels like patient has lost some weight which isn't alarming but the stomach pain does prevent him from eating at times.  He doesn't have regular bowel movements.  He takes stool softeners which does give him somewhat regular bowel movements.  He has an anal fissure.  Miralax  causes him to have accidents.  However, right now patient's symptoms are okay and dad does not have any concerns.   Relevant past medical, surgical, family and social history reviewed and updated as indicated. Interim medical history since our last visit reviewed. Allergies and medications reviewed and updated.  Review of Systems  Gastrointestinal:  Positive for abdominal pain and constipation.    Per HPI unless specifically indicated above     Objective:    BP 133/86 (BP Location: Right Arm, Patient Position: Sitting, Cuff Size: Large)   Pulse 64   Ht 5' 9 (1.753 m)   Wt 176 lb 3.2 oz (79.9 kg)   SpO2 100%   BMI 26.02 kg/m   Wt Readings from Last 3 Encounters:  08/22/23 176 lb 3.2 oz (79.9 kg)  02/19/23 181 lb 3.2 oz (82.2 kg)  01/15/23 189 lb 12.8 oz (86.1 kg)    Physical Exam Vitals and nursing note reviewed.  Constitutional:      General: He is not in acute distress.    Appearance: Normal appearance. He is not ill-appearing, toxic-appearing or diaphoretic.  HENT:     Head: Normocephalic.     Right Ear: External ear  normal.     Left Ear: External ear normal.     Nose: Nose normal. No congestion or rhinorrhea.     Mouth/Throat:     Mouth: Mucous membranes are moist.  Eyes:     General:        Right eye: No discharge.        Left eye: No discharge.     Extraocular Movements: Extraocular movements intact.     Conjunctiva/sclera: Conjunctivae normal.     Pupils: Pupils are equal, round, and reactive to light.  Cardiovascular:     Rate and Rhythm: Normal rate and regular rhythm.     Heart sounds: No murmur heard. Pulmonary:     Effort: Pulmonary effort is normal. No respiratory distress.     Breath sounds: Normal breath sounds. No wheezing, rhonchi or rales.  Abdominal:     General: Abdomen is flat. Bowel sounds are normal. There is no distension.     Palpations: There is no mass.     Tenderness: There is no abdominal tenderness. There is no right CVA tenderness, left CVA tenderness, guarding or rebound.     Hernia: No hernia is present.  Musculoskeletal:     Cervical back: Normal range of motion and  neck supple.  Skin:    General: Skin is warm and dry.     Capillary Refill: Capillary refill takes less than 2 seconds.  Neurological:     General: No focal deficit present.     Mental Status: He is alert and oriented to person, place, and time.  Psychiatric:        Mood and Affect: Mood normal.        Behavior: Behavior normal.     Results for orders placed or performed in visit on 02/19/23  Urinalysis, Routine w reflex microscopic   Collection Time: 02/19/23  9:50 AM  Result Value Ref Range   Specific Gravity, UA 1.015 1.005 - 1.030   pH, UA 7.0 5.0 - 7.5   Color, UA Yellow Yellow   Appearance Ur Clear Clear   Leukocytes,UA Negative Negative   Protein,UA Negative Negative/Trace   Glucose, UA Negative Negative   Ketones, UA Negative Negative   RBC, UA Negative Negative   Bilirubin, UA Negative Negative   Urobilinogen, Ur 0.2 0.2 - 1.0 mg/dL   Nitrite, UA Negative Negative    Microscopic Examination Comment   Comp Met (CMET)   Collection Time: 02/19/23  9:52 AM  Result Value Ref Range   Glucose 89 70 - 99 mg/dL   BUN 10 6 - 20 mg/dL   Creatinine, Ser 8.81 0.76 - 1.27 mg/dL   eGFR 82 >40 fO/fpw/8.26   BUN/Creatinine Ratio 8 (L) 9 - 20   Sodium 140 134 - 144 mmol/L   Potassium 4.1 3.5 - 5.2 mmol/L   Chloride 99 96 - 106 mmol/L   CO2 25 20 - 29 mmol/L   Calcium 10.1 8.7 - 10.2 mg/dL   Total Protein 7.2 6.0 - 8.5 g/dL   Albumin 4.8 4.1 - 5.1 g/dL   Globulin, Total 2.4 1.5 - 4.5 g/dL   Bilirubin Total 0.4 0.0 - 1.2 mg/dL   Alkaline Phosphatase 85 44 - 121 IU/L   AST 27 0 - 40 IU/L   ALT 32 0 - 44 IU/L  CBC w/Diff   Collection Time: 02/19/23  9:52 AM  Result Value Ref Range   WBC 6.2 3.4 - 10.8 x10E3/uL   RBC 5.60 4.14 - 5.80 x10E6/uL   Hemoglobin 16.9 13.0 - 17.7 g/dL   Hematocrit 49.0 62.4 - 51.0 %   MCV 91 79 - 97 fL   MCH 30.2 26.6 - 33.0 pg   MCHC 33.2 31.5 - 35.7 g/dL   RDW 87.2 88.3 - 84.5 %   Platelets 280 150 - 450 x10E3/uL   Neutrophils 62 Not Estab. %   Lymphs 34 Not Estab. %   Monocytes 4 Not Estab. %   Eos 0 Not Estab. %   Basos 0 Not Estab. %   Neutrophils Absolute 3.8 1.4 - 7.0 x10E3/uL   Lymphocytes Absolute 2.1 0.7 - 3.1 x10E3/uL   Monocytes Absolute 0.3 0.1 - 0.9 x10E3/uL   EOS (ABSOLUTE) 0.0 0.0 - 0.4 x10E3/uL   Basophils Absolute 0.0 0.0 - 0.2 x10E3/uL   Immature Granulocytes 0 Not Estab. %   Immature Grans (Abs) 0.0 0.0 - 0.1 x10E3/uL  Lipid Profile   Collection Time: 02/19/23  9:52 AM  Result Value Ref Range   Cholesterol, Total 210 (H) 100 - 199 mg/dL   Triglycerides 739 (H) 0 - 149 mg/dL   HDL 50 >60 mg/dL   VLDL Cholesterol Cal 45 (H) 5 - 40 mg/dL   LDL Chol Calc (NIH) 884 (H) 0 -  99 mg/dL   Chol/HDL Ratio 4.2 0.0 - 5.0 ratio  HgB A1c   Collection Time: 02/19/23  9:52 AM  Result Value Ref Range   Hgb A1c MFr Bld 6.0 (H) 4.8 - 5.6 %   Est. average glucose Bld gHb Est-mCnc 126 mg/dL  TSH   Collection Time:  02/19/23  9:52 AM  Result Value Ref Range   TSH 3.270 0.450 - 4.500 uIU/mL      Assessment & Plan:   Problem List Items Addressed This Visit   None Visit Diagnoses       Generalized abdominal pain    -  Primary   Intermittent issues. Has had extensive workup with GI.  Uses Colace to help with bowel movements. Continue with regimen and continue to follow with specialist.        Follow up plan: Return in about 3 months (around 11/19/2023) for Physical and Fasting labs, MWV.

## 2023-11-10 ENCOUNTER — Emergency Department
Admission: EM | Admit: 2023-11-10 | Discharge: 2023-11-10 | Disposition: A | Discharge status: Home / Self Care | Attending: Emergency Medicine | Admitting: Emergency Medicine

## 2023-11-10 ENCOUNTER — Other Ambulatory Visit: Payer: Self-pay

## 2023-11-10 ENCOUNTER — Emergency Department

## 2023-11-10 DIAGNOSIS — R112 Nausea with vomiting, unspecified: Secondary | ICD-10-CM | POA: Insufficient documentation

## 2023-11-10 DIAGNOSIS — R1031 Right lower quadrant pain: Secondary | ICD-10-CM | POA: Insufficient documentation

## 2023-11-10 DIAGNOSIS — F840 Autistic disorder: Secondary | ICD-10-CM | POA: Insufficient documentation

## 2023-11-10 DIAGNOSIS — R197 Diarrhea, unspecified: Secondary | ICD-10-CM | POA: Diagnosis not present

## 2023-11-10 HISTORY — DX: Developmental disorder of scholastic skills, unspecified: F81.9

## 2023-11-10 HISTORY — DX: Obsessive-compulsive disorder, unspecified: F42.9

## 2023-11-10 LAB — URINALYSIS, ROUTINE W REFLEX MICROSCOPIC
Bilirubin Urine: NEGATIVE
Glucose, UA: NEGATIVE mg/dL
Hgb urine dipstick: NEGATIVE
Ketones, ur: NEGATIVE mg/dL
Leukocytes,Ua: NEGATIVE
Nitrite: NEGATIVE
Protein, ur: NEGATIVE mg/dL
Specific Gravity, Urine: 1.026 (ref 1.005–1.030)
pH: 6 (ref 5.0–8.0)

## 2023-11-10 LAB — CBC
HCT: 46.8 % (ref 39.0–52.0)
Hemoglobin: 15.9 g/dL (ref 13.0–17.0)
MCH: 30.3 pg (ref 26.0–34.0)
MCHC: 34 g/dL (ref 30.0–36.0)
MCV: 89.1 fL (ref 80.0–100.0)
Platelets: 242 10*3/uL (ref 150–400)
RBC: 5.25 MIL/uL (ref 4.22–5.81)
RDW: 12.1 % (ref 11.5–15.5)
WBC: 5 10*3/uL (ref 4.0–10.5)
nRBC: 0 % (ref 0.0–0.2)

## 2023-11-10 LAB — COMPREHENSIVE METABOLIC PANEL WITH GFR
ALT: 22 U/L (ref 0–44)
AST: 23 U/L (ref 15–41)
Albumin: 4.7 g/dL (ref 3.5–5.0)
Alkaline Phosphatase: 53 U/L (ref 38–126)
Anion gap: 10 (ref 5–15)
BUN: 12 mg/dL (ref 6–20)
CO2: 27 mmol/L (ref 22–32)
Calcium: 9.5 mg/dL (ref 8.9–10.3)
Chloride: 103 mmol/L (ref 98–111)
Creatinine, Ser: 1.39 mg/dL — ABNORMAL HIGH (ref 0.61–1.24)
GFR, Estimated: >60 mL/min (ref >60)
Glucose, Bld: 164 mg/dL — ABNORMAL HIGH (ref 70–99)
Potassium: 3.5 mmol/L (ref 3.5–5.1)
Sodium: 140 mmol/L (ref 135–145)
Total Bilirubin: 1 mg/dL (ref 0.0–1.2)
Total Protein: 7.6 g/dL (ref 6.5–8.1)

## 2023-11-10 LAB — LIPASE, BLOOD: Lipase: 28 U/L (ref 11–51)

## 2023-11-10 MED ORDER — ONDANSETRON HCL 4 MG/2ML IJ SOLN
4.0000 mg | Freq: Once | INTRAMUSCULAR | Status: AC
  Administered 2023-11-10: 4 mg via INTRAVENOUS
  Filled 2023-11-10: qty 2

## 2023-11-10 MED ORDER — SODIUM CHLORIDE 0.9 % IV BOLUS
1000.0000 mL | Freq: Once | INTRAVENOUS | Status: AC
  Administered 2023-11-10: 1000 mL via INTRAVENOUS

## 2023-11-10 MED ORDER — ONDANSETRON 4 MG PO TBDP: 4.0000 mg | ORAL_TABLET | Freq: Three times a day (TID) | ORAL | 0 refills | Status: AC | PRN

## 2023-11-10 MED ORDER — MORPHINE SULFATE (PF) 4 MG/ML IV SOLN
4.0000 mg | Freq: Once | INTRAVENOUS | Status: AC
  Administered 2023-11-10: 4 mg via INTRAVENOUS
  Filled 2023-11-10: qty 1

## 2023-11-10 MED ORDER — IOHEXOL 300 MG/ML  SOLN
100.0000 mL | Freq: Once | INTRAMUSCULAR | Status: AC | PRN
  Administered 2023-11-10: 100 mL via INTRAVENOUS

## 2023-11-10 MED ORDER — KETOROLAC TROMETHAMINE 30 MG/ML IJ SOLN
15.0000 mg | Freq: Once | INTRAMUSCULAR | Status: AC
Start: 2023-11-10 — End: 2023-11-10
  Administered 2023-11-10: 15 mg via INTRAVENOUS
  Filled 2023-11-10: qty 1

## 2023-11-10 NOTE — ED Triage Notes (Signed)
 Pt to ED with mother, POV for abdominal pain (pointing to RLQ), vomiting (clear mucous), diarrhea worse since 2 days but ongoing for a long time. Pt is autistic and CI. Mother states pt has frequent constipation. Pt is crying and whimpering. Pt does not have appendix.

## 2023-11-10 NOTE — ED Provider Notes (Signed)
 [facility] Provider Note    Event Date/Time   First MD Initiated Contact with Patient 11/10/23 [phone]     (approximate)  History   Chief Complaint: Abdominal Pain and Diarrhea  HPI  Timothy Rose is a 38 y.o. male with a past medical history of autism, schizophrenia, presents to the emergency department for nausea vomiting diarrhea and abdominal pain.  According to the patient's mother for the last 2 days patient has been nauseated with occasional episodes of vomiting.  Has been experiencing diarrhea for the last few weeks per mom.  However today patient was also complaining of some right lower quadrant abdominal pain.  Per mom appendix has already been removed.  Here patient is awake alert, no distress but continues to tell me that he does not feel good.  Patient has some intellectual disability, but does on occasion grab his right abdomen as well.  No reported fever at home per mom.  Physical Exam   Triage Vital Signs: ED Triage Vitals  Encounter Vitals Group     BP 11/10/23 0934 125/78     Systolic BP Percentile --      Diastolic BP Percentile --      Pulse Rate 11/10/23 0934 85     Resp 11/10/23 0934 20     Temp 11/10/23 0934 98.1 F (36.7 C)     Temp Source 11/10/23 0934 Oral     SpO2 11/10/23 0934 100 %     Weight 11/10/23 0932 140 lb (63.5 kg)     Height 11/10/23 0932 5\' 5"  (1.651 m)     Head Circumference --      Peak Flow --      Pain Score --      Pain Loc --      Pain Education --      Exclude from Growth Chart --     Most recent vital signs: Vitals:   11/10/23 0934  BP: 125/78  Pulse: 85  Resp: 20  Temp: 98.1 F (36.7 C)  SpO2: 100%    General: Awake, no distress.  CV:  Good peripheral perfusion.  Regular rate and rhythm  Resp:  Normal effort.  Equal breath sounds bilaterally.  Abd:  No distention.  Soft, nontender.  No rebound or guarding.  No reaction to abdominal palpation.  ED Results / Procedures / Treatments    RADIOLOGY  I have reviewed interpret the CT images.  No significant abnormality seen on my evaluation. Radiology is read the CT scan as negative for acute abnormality besides retained fluid within the bowel likely indicating diarrheal disease.  Possible retained stool in the distal colon or impaction.   MEDICATIONS ORDERED IN ED: Medications - No data to display   IMPRESSION / MDM / ASSESSMENT AND PLAN / ED COURSE  I reviewed the triage vital signs and the nursing notes.  Patient's presentation is most consistent with acute presentation with potential threat to life or bodily function.  Patient presents emergency department for 2 days of nausea vomiting, approximately 2 weeks of intermittent diarrhea (although mom states this is somewhat chronic with diarrhea and constipation), and today with right-sided abdominal pain.  Overall the patient appears well, reassuring vital signs, no reaction to abdominal palpation.  However exam may be somewhat limited due to intellectual disability.  We will check labs including a CBC chemistry lipase and urinalysis.  Will treat pain and nausea as well as IV hydrate.  Will proceed with a CT scan  of the abdomen/pelvis to rule out intra-abdominal pathology.  Differential would include gastroenteritis, enteritis, gastritis, colitis or diverticulitis, gallbladder pathology, UTI or pyelonephritis, ureterolithiasis, among others.  CT scan shows possible distal fecal impaction.  I performed a digital disimpaction was able to remove a small amount of stool.  I was able to break up the harder stool in the distal rectum.  We will discharge home to follow-up with his doctor.  CBC is reassuring, chemistry reassuring lipase normal urinalysis normal and CT scan shows no other concerning finding.  FINAL CLINICAL IMPRESSION(S) / ED DIAGNOSES   Abdominal pain Nausea vomiting diarrhea   Note:  This document was prepared using Dragon voice recognition software and may  include unintentional dictation errors.   Ruth Cove, MD 11/10/23 1320

## 2023-11-10 NOTE — Discharge Instructions (Addendum)
 Please take your nausea medication as needed, as prescribed.  Return to the emergency department for any worsening pain development of fever or any other symptom concerning to yourself.

## 2023-11-26 ENCOUNTER — Ambulatory Visit: Payer: Medicare HMO | Admitting: Nurse Practitioner

## 2023-11-26 ENCOUNTER — Encounter: Payer: Self-pay | Admitting: Nurse Practitioner

## 2023-11-26 VITALS — BP 127/89 | HR 59 | Ht 69.0 in | Wt 178.8 lb

## 2023-11-26 DIAGNOSIS — Z0000 Encounter for general adult medical examination without abnormal findings: Secondary | ICD-10-CM | POA: Diagnosis not present

## 2023-11-26 DIAGNOSIS — Z114 Encounter for screening for human immunodeficiency virus [HIV]: Secondary | ICD-10-CM

## 2023-11-26 DIAGNOSIS — Z136 Encounter for screening for cardiovascular disorders: Secondary | ICD-10-CM | POA: Diagnosis not present

## 2023-11-26 DIAGNOSIS — Z1159 Encounter for screening for other viral diseases: Secondary | ICD-10-CM

## 2023-11-26 DIAGNOSIS — R7309 Other abnormal glucose: Secondary | ICD-10-CM

## 2023-11-26 NOTE — Progress Notes (Signed)
 BP 127/89   Pulse (!) 59   Ht 5\' 9"  (1.753 m)   Wt 178 lb 12.8 oz (81.1 kg)   SpO2 99%   BMI 26.40 kg/m    Subjective:    Patient ID: Timothy Rose, male    DOB: 1986/05/07, 38 y.o.   MRN: [id number]  HPI: Timothy Rose is a 38 y.o. male presenting on 11/26/2023 for comprehensive medical examination. Current medical complaints include:none  He currently lives with: Interim Problems from his last visit: no   Denies HA, CP, SOB, dizziness, palpitations, visual changes, and lower extremity swelling.   Depression Screen done today and results listed below:     11/26/2023    9:16 AM 08/22/2023    9:30 AM 02/19/2023    9:10 AM 01/15/2023    9:52 AM  Depression screen PHQ 2/9  Decreased Interest 2 0 1 1  Down, Depressed, Hopeless 1 0 1 1  PHQ - 2 Score 3 0 2 2  Altered sleeping 1 0 1 1  Tired, decreased energy 1 0 0 1  Change in appetite 0 0 0 1  Feeling bad or failure about yourself  0 0 1 0  Trouble concentrating 0 0 0 1  Moving slowly or fidgety/restless 0 0 0 0  Suicidal thoughts 0 0 0 0  PHQ-9 Score 5 0 4 6  Difficult doing work/chores    Somewhat difficult    The patient does not have a history of falls. I did complete a risk assessment for falls. A plan of care for falls was documented.   Past Medical History:  Past Medical History:  Diagnosis Date   Anxiety    Autism    Intellectual delay    OCD (obsessive compulsive disorder)    Schizophrenia (HCC)    Seizures (HCC)     Surgical History:  Past Surgical History:  Procedure Laterality Date   APPENDECTOMY     COLONOSCOPY WITH PROPOFOL  N/A 02/08/2017   Procedure: COLONOSCOPY WITH PROPOFOL ;  Surgeon: Luke Salaam, MD;  Location: [facility] ENDOSCOPY;  Service: Gastroenterology;  Laterality: N/A;   cranial surgery     ESOPHAGOGASTRODUODENOSCOPY (EGD) WITH PROPOFOL  N/A 08/10/2016   Procedure: ESOPHAGOGASTRODUODENOSCOPY (EGD) WITH PROPOFOL ;  Surgeon: Luke Salaam, MD;  Location: [facility] ENDOSCOPY;  Service: Endoscopy;   Laterality: N/A;   EUS N/A 03/08/2017   Procedure: FULL UPPER ENDOSCOPIC ULTRASOUND (EUS) RADIAL;  Surgeon: Creola Doheny, MD;  Location: [facility] ENDOSCOPY;  Service: Endoscopy;  Laterality: N/A;   INGUINAL HERNIA REPAIR Left    LAPAROSCOPIC APPENDECTOMY N/A 02/18/2015   Procedure: Laparoscopic appendectomy, umbilical hernia repair;  Surgeon: Lauretta Ponto, MD;  Location: [facility] ORS;  Service: General;  Laterality: N/A;   pyloric stenosis     SURGERY SCROTAL / TESTICULAR     TYMPANOSTOMY TUBE PLACEMENT      Medications:  Current Outpatient Medications on File Prior to Visit  Medication Sig   ARIPiprazole (ABILIFY) 2 MG tablet Take 4 tablets by mouth daily.   azelastine (ASTELIN) 0.1 % nasal spray    dexlansoprazole  (DEXILANT ) 60 MG capsule Take 1 capsule (60 mg total) by mouth daily.   diazepam (VALIUM) 10 MG tablet Take 10 mg by mouth 3 (three) times daily.   fluticasone (FLONASE) 50 MCG/ACT nasal spray    Melatonin 10 MG TABS Take 10 mg by mouth at bedtime as needed.   naproxen sodium (ANAPROX) 220 MG tablet Take 110 mg by mouth 2 (two) times daily with a meal.  ondansetron  (ZOFRAN -ODT) 4 MG disintegrating tablet Take 1 tablet (4 mg total) by mouth every 8 (eight) hours as needed for nausea or vomiting.   polyethylene glycol (MIRALAX  / GLYCOLAX ) packet Take 17 g by mouth daily as needed.   sertraline (ZOLOFT) 100 MG tablet TAKE 1 TABLET BY MOUTH DAILY. TAKE ALONG WITH 50 MG TABLET FOR A TOTAL DOSE OF 150 MG EVERY MORNING.   ranitidine  (ZANTAC ) 150 MG tablet Take 1 tablet (150 mg total) by mouth 2 (two) times daily.   No current facility-administered medications on file prior to visit.    Allergies:  Allergies  Allergen Reactions   Sulfa Antibiotics Anaphylaxis and Hives   Haldol [Haloperidol] Swelling   Viibryd [Vilazodone Hcl] Other (See Comments)    Gets aggressive     Social History:  Social History   Socioeconomic History   Marital status: Single    Spouse name: Not on file    Number of children: Not on file   Years of education: Not on file   Highest education level: Not on file  Occupational History   Not on file  Tobacco Use   Smoking status: Never   Smokeless tobacco: Never  Vaping Use   Vaping status: Never Used  Substance and Sexual Activity   Alcohol use: No   Drug use: No   Sexual activity: Not on file  Other Topics Concern   Not on file  Social History Narrative   Not on file   Social Drivers of Health   Financial Resource Strain: Not on file  Food Insecurity: No Food Insecurity (11/26/2023)   Hunger Vital Sign    Worried About Running Out of Food in the Last Year: Never true    Ran Out of Food in the Last Year: Never true  Transportation Needs: No Transportation Needs (11/26/2023)   PRAPARE - Administrator, Civil Service (Medical): No    Lack of Transportation (Non-Medical): No  Physical Activity: Not on file  Stress: Not on file  Social Connections: Not on file  Intimate Partner Violence: Not At Risk (11/26/2023)   Humiliation, Afraid, Rape, and Kick questionnaire    Fear of Current or Ex-Partner: No    Emotionally Abused: No    Physically Abused: No    Sexually Abused: No   Social History   Tobacco Use  Smoking Status Never  Smokeless Tobacco Never   Social History   Substance and Sexual Activity  Alcohol Use No    Family History:  Family History  Problem Relation Age of Onset   Diabetes Mother    Hypertension Father    Diabetes Brother     Past medical history, surgical history, medications, allergies, family history and social history reviewed with patient today and changes made to appropriate areas of the chart.   Review of Systems  Eyes:  Negative for blurred vision and double vision.  Respiratory:  Negative for shortness of breath.   Cardiovascular:  Negative for chest pain, palpitations and leg swelling.  Neurological:  Negative for dizziness and headaches.   All other ROS negative except what  is listed above and in the HPI.      Objective:     BP 127/89   Pulse (!) 59   Ht 5\' 9"  (1.753 m)   Wt 178 lb 12.8 oz (81.1 kg)   SpO2 99%   BMI 26.40 kg/m   Wt Readings from Last 3 Encounters:  11/26/23 178 lb 12.8 oz (81.1 kg)  11/10/23 140 lb (63.5 kg)  08/22/23 176 lb 3.2 oz (79.9 kg)    Physical Exam Vitals and nursing note reviewed.  Constitutional:      General: He is not in acute distress.    Appearance: Normal appearance. He is not ill-appearing, toxic-appearing or diaphoretic.  HENT:     Head: Normocephalic.     Right Ear: Tympanic membrane, ear canal and external ear normal.     Left Ear: Tympanic membrane, ear canal and external ear normal.     Nose: Nose normal. No congestion or rhinorrhea.     Mouth/Throat:     Mouth: Mucous membranes are moist.  Eyes:     General:        Right eye: No discharge.        Left eye: No discharge.     Extraocular Movements: Extraocular movements intact.     Conjunctiva/sclera: Conjunctivae normal.     Pupils: Pupils are equal, round, and reactive to light.  Cardiovascular:     Rate and Rhythm: Normal rate and regular rhythm.     Heart sounds: No murmur heard. Pulmonary:     Effort: Pulmonary effort is normal. No respiratory distress.     Breath sounds: Normal breath sounds. No wheezing, rhonchi or rales.  Abdominal:     General: Abdomen is flat. Bowel sounds are normal. There is no distension.     Palpations: Abdomen is soft.     Tenderness: There is no abdominal tenderness. There is no guarding.  Musculoskeletal:     Cervical back: Normal range of motion and neck supple.  Skin:    General: Skin is warm and dry.     Capillary Refill: Capillary refill takes less than 2 seconds.  Neurological:     General: No focal deficit present.     Mental Status: He is alert and oriented to person, place, and time.     Cranial Nerves: No cranial nerve deficit.     Motor: No weakness.     Deep Tendon Reflexes: Reflexes normal.   Psychiatric:        Mood and Affect: Mood normal.        Behavior: Behavior normal.        Thought Content: Thought content normal.        Judgment: Judgment normal.     Results for orders placed or performed during the hospital encounter of 11/10/23  Lipase, blood   Collection Time: 11/10/23  9:50 AM  Result Value Ref Range   Lipase 28 11 - 51 U/L  Comprehensive metabolic panel   Collection Time: 11/10/23  9:50 AM  Result Value Ref Range   Sodium 140 135 - 145 mmol/L   Potassium 3.5 3.5 - 5.1 mmol/L   Chloride 103 98 - 111 mmol/L   CO2 27 22 - 32 mmol/L   Glucose, Bld 164 (H) 70 - 99 mg/dL   BUN 12 6 - 20 mg/dL   Creatinine, Ser 8.29 (H) 0.61 - 1.24 mg/dL   Calcium 9.5 8.9 - 56.2 mg/dL   Total Protein 7.6 6.5 - 8.1 g/dL   Albumin 4.7 3.5 - 5.0 g/dL   AST 23 15 - 41 U/L   ALT 22 0 - 44 U/L   Alkaline Phosphatase 53 38 - 126 U/L   Total Bilirubin 1.0 0.0 - 1.2 mg/dL   GFR, Estimated >13 >08 mL/min   Anion gap 10 5 - 15  CBC   Collection Time: 11/10/23  9:50 AM  Result Value Ref Range   WBC 5.0 4.0 - 10.5 K/uL   RBC 5.25 4.22 - 5.81 MIL/uL   Hemoglobin 15.9 13.0 - 17.0 g/dL   HCT 47.8 29.5 - 62.1 %   MCV 89.1 80.0 - 100.0 fL   MCH 30.3 26.0 - 34.0 pg   MCHC 34.0 30.0 - 36.0 g/dL   RDW 30.8 65.7 - 84.6 %   Platelets 242 150 - 400 K/uL   nRBC 0.0 0.0 - 0.2 %  Urinalysis, Routine w reflex microscopic -Urine, Clean Catch   Collection Time: 11/10/23  9:50 AM  Result Value Ref Range   Color, Urine YELLOW (A) YELLOW   APPearance CLEAR (A) CLEAR   Specific Gravity, Urine 1.026 1.005 - 1.030   pH 6.0 5.0 - 8.0   Glucose, UA NEGATIVE NEGATIVE mg/dL   Hgb urine dipstick NEGATIVE NEGATIVE   Bilirubin Urine NEGATIVE NEGATIVE   Ketones, ur NEGATIVE NEGATIVE mg/dL   Protein, ur NEGATIVE NEGATIVE mg/dL   Nitrite NEGATIVE NEGATIVE   Leukocytes,Ua NEGATIVE NEGATIVE      Assessment & Plan:   Problem List Items Addressed This Visit   None Visit Diagnoses       Encounter  for Medicare annual wellness exam    -  Primary     Annual physical exam       Health maintenance reviewed during visit today.  Labs ordered.  Vaccines reviewed.   Relevant Orders   TSH   Lipid panel   CBC with Differential/Platelet   Comprehensive metabolic panel with GFR   Hepatitis C antibody   HIV Antibody (routine testing w rflx)     Screening for ischemic heart disease       Relevant Orders   Lipid panel     Screening for HIV (human immunodeficiency virus)       Relevant Orders   HIV Antibody (routine testing w rflx)     Encounter for hepatitis C screening test for low risk patient       Relevant Orders   Hepatitis C antibody     Elevated glucose       Relevant Orders   HgB A1c        Discussed aspirin prophylaxis for myocardial infarction prevention and decision was it was not indicated  LABORATORY TESTING:  Health maintenance labs ordered today as discussed above.     IMMUNIZATIONS:   - Tdap: Tetanus vaccination status reviewed: last tetanus booster within 10 years. - Influenza: Postponed to flu season - Pneumovax: Not applicable - Prevnar: Not applicable - COVID: Not applicable - HPV: Not applicable - Shingrix vaccine: Not applicable  SCREENING: - Colonoscopy: Not applicable  Discussed with patient purpose of the colonoscopy is to detect colon cancer at curable precancerous or early stages   - AAA Screening: Not applicable  -Hearing Test: Not applicable  -Spirometry: Not applicable   PATIENT COUNSELING:    Sexuality: Discussed sexually transmitted diseases, partner selection, use of condoms, avoidance of unintended pregnancy  and contraceptive alternatives.   Advised to avoid cigarette smoking.  I discussed with the patient that most people either abstain from alcohol or drink within safe limits (<=14/week and <=4 drinks/occasion for males, <=7/weeks and <= 3 drinks/occasion for females) and that the risk for alcohol disorders and other health effects  rises proportionally with the number of drinks per week and how often a drinker exceeds daily limits.  Discussed cessation/primary prevention of drug use and availability of treatment for abuse.   Diet:  Encouraged to adjust caloric intake to maintain  or achieve ideal body weight, to reduce intake of dietary saturated fat and total fat, to limit sodium intake by avoiding high sodium foods and not adding table salt, and to maintain adequate dietary potassium and calcium preferably from fresh fruits, vegetables, and low-fat dairy products.    stressed the importance of regular exercise  Injury prevention: Discussed safety belts, safety helmets, smoke detector, smoking near bedding or upholstery.   Dental health: Discussed importance of regular tooth brushing, flossing, and dental visits.   Follow up plan: NEXT PREVENTATIVE PHYSICAL DUE IN 1 YEAR. Return in 1 year (on 11/25/2024).

## 2023-11-26 NOTE — Progress Notes (Signed)
 Subjective:   Timothy Rose is a 38 y.o. male who presents for Medicare Annual/Subsequent preventive examination.  Visit Complete: In person  Patient Medicare AWV questionnaire was completed by the patient on 11/26/23 ; I have confirmed that all information answered by patient is correct and no changes since this date.        Objective:     Today's Vitals   11/26/23 0904  BP: 127/89  Pulse: (!) 59  SpO2: 99%  Weight: 178 lb 12.8 oz (81.1 kg)  Height: 5\' 9"  (1.753 m)  PainSc: 1    Body mass index is 26.4 kg/m.     11/10/2023    9:33 AM 10/22/2022    9:24 AM 05/13/2022    3:40 PM 02/17/2022   12:45 PM 03/08/2017   12:14 PM 02/08/2017    8:18 AM 08/10/2016    9:32 AM  Advanced Directives  Does Patient Have a Medical Advance Directive? Yes No No No Yes No No  Type of Advance Directive Healthcare Power of Attorney    --      Current Medications (verified) Outpatient Encounter Medications as of 11/26/2023  Medication Sig   ARIPiprazole (ABILIFY) 2 MG tablet Take 4 tablets by mouth daily.   azelastine (ASTELIN) 0.1 % nasal spray    dexlansoprazole  (DEXILANT ) 60 MG capsule Take 1 capsule (60 mg total) by mouth daily.   diazepam (VALIUM) 10 MG tablet Take 10 mg by mouth 3 (three) times daily.   fluticasone (FLONASE) 50 MCG/ACT nasal spray    Melatonin 10 MG TABS Take 10 mg by mouth at bedtime as needed.   naproxen sodium (ANAPROX) 220 MG tablet Take 110 mg by mouth 2 (two) times daily with a meal.   ondansetron  (ZOFRAN -ODT) 4 MG disintegrating tablet Take 1 tablet (4 mg total) by mouth every 8 (eight) hours as needed for nausea or vomiting.   polyethylene glycol (MIRALAX  / GLYCOLAX ) packet Take 17 g by mouth daily as needed.   sertraline (ZOLOFT) 100 MG tablet TAKE 1 TABLET BY MOUTH DAILY. TAKE ALONG WITH 50 MG TABLET FOR A TOTAL DOSE OF 150 MG EVERY MORNING.   [DISCONTINUED] Paliperidone  (INVEGA ) 1.5 MG TB24 Take 1.5 mg by mouth at bedtime.   [DISCONTINUED] QUEtiapine  (SEROQUEL) 25 MG tablet Take 25 mg by mouth at bedtime.   ranitidine  (ZANTAC ) 150 MG tablet Take 1 tablet (150 mg total) by mouth 2 (two) times daily.   [DISCONTINUED] PARoxetine (PAXIL-CR) 12.5 MG 24 hr tablet Take 12.5 mg by mouth daily.   No facility-administered encounter medications on file as of 11/26/2023.    Allergies (verified) Sulfa antibiotics, Haldol [haloperidol], and Viibryd [vilazodone hcl]   History: Past Medical History:  Diagnosis Date   Anxiety    Autism    Intellectual delay    OCD (obsessive compulsive disorder)    Schizophrenia (HCC)    Seizures (HCC)    Past Surgical History:  Procedure Laterality Date   APPENDECTOMY     COLONOSCOPY WITH PROPOFOL  N/A 02/08/2017   Procedure: COLONOSCOPY WITH PROPOFOL ;  Surgeon: Luke Salaam, MD;  Location: [facility] ENDOSCOPY;  Service: Gastroenterology;  Laterality: N/A;   cranial surgery     ESOPHAGOGASTRODUODENOSCOPY (EGD) WITH PROPOFOL  N/A 08/10/2016   Procedure: ESOPHAGOGASTRODUODENOSCOPY (EGD) WITH PROPOFOL ;  Surgeon: Luke Salaam, MD;  Location: [facility] ENDOSCOPY;  Service: Endoscopy;  Laterality: N/A;   EUS N/A 03/08/2017   Procedure: FULL UPPER ENDOSCOPIC ULTRASOUND (EUS) RADIAL;  Surgeon: Creola Doheny, MD;  Location: [facility] ENDOSCOPY;  Service:  Endoscopy;  Laterality: N/A;   INGUINAL HERNIA REPAIR Left    LAPAROSCOPIC APPENDECTOMY N/A 02/18/2015   Procedure: Laparoscopic appendectomy, umbilical hernia repair;  Surgeon: Lauretta Ponto, MD;  Location: [facility] ORS;  Service: General;  Laterality: N/A;   pyloric stenosis     SURGERY SCROTAL / TESTICULAR     TYMPANOSTOMY TUBE PLACEMENT     Family History  Problem Relation Age of Onset   Diabetes Mother    Hypertension Father    Diabetes Brother    Social History   Socioeconomic History   Marital status: Single    Spouse name: Not on file   Number of children: Not on file   Years of education: Not on file   Highest education level: Not on file  Occupational History   Not on file   Tobacco Use   Smoking status: Never   Smokeless tobacco: Never  Vaping Use   Vaping status: Never Used  Substance and Sexual Activity   Alcohol use: No   Drug use: No   Sexual activity: Not on file  Other Topics Concern   Not on file  Social History Narrative   Not on file   Social Drivers of Health   Financial Resource Strain: Not on file  Food Insecurity: No Food Insecurity (11/26/2023)   Hunger Vital Sign    Worried About Running Out of Food in the Last Year: Never true    Ran Out of Food in the Last Year: Never true  Transportation Needs: No Transportation Needs (11/26/2023)   PRAPARE - Administrator, Civil Service (Medical): No    Lack of Transportation (Non-Medical): No  Physical Activity: Not on file  Stress: Not on file  Social Connections: Not on file    Tobacco Counseling Counseling given: Not Answered   Clinical Intake:     Pain Score: 1                   Activities of Daily Living     No data to display          Patient Care Team: Aileen Alexanders, NP as PCP - General (Nurse Practitioner)  Indicate any recent Medical Services you may have received from other than [facility] providers in the past year (date may be approximate).     Assessment:    This is a routine wellness examination for [place].  Hearing/Vision screen No results found.   Goals Addressed   None    Depression Screen    11/26/2023    9:16 AM 08/22/2023    9:30 AM 02/19/2023    9:10 AM 01/15/2023    9:52 AM  PHQ 2/9 Scores  PHQ - 2 Score 3 0 2 2  PHQ- 9 Score 5 0 4 6    Fall Risk    11/26/2023    9:16 AM 02/19/2023    9:09 AM 01/15/2023    9:45 AM  Fall Risk   Falls in the past year? 1 0 0  Number falls in past yr: 1 0 0  Injury with Fall? 0 0 0  Risk for fall due to :  No Fall Risks No Fall Risks  Follow up  Falls evaluation completed Falls evaluation completed    MEDICARE RISK AT HOME: Medicare Risk at Home Any stairs in or around the home?:  No If so, are there any without handrails?: No Home free of loose throw rugs in walkways, pet beds, electrical cords, etc?: No Adequate lighting  in your home to reduce risk of falls?: Yes Life alert?: No Use of a cane, walker or w/c?: No Grab bars in the bathroom?: No Shower chair or bench in shower?: No Elevated toilet seat or a handicapped toilet?: No  TIMED UP AND GO:  Was the test performed?  Yes  Length of time to ambulate 10 feet: 8 sec Gait steady and fast without use of assistive device    Cognitive Function:        Immunizations Immunization History  Administered Date(s) Administered   PFIZER(Purple Top)SARS-COV-2 Vaccination 10/22/2019, 11/12/2019, 06/24/2020, 06/29/2021   Tdap 02/19/2023    TDAP status: Up to date  Flu Vaccine status: Up to date  Pneumococcal vaccine status: Declined,  Education has been provided regarding the importance of this vaccine but patient still declined. Advised may receive this vaccine at local pharmacy or Health Dept. Aware to provide a copy of the vaccination record if obtained from local pharmacy or Health Dept. Verbalized acceptance and understanding.   Covid-19 vaccine status: Completed vaccines  Qualifies for Shingles Vaccine? No      Screening Tests Health Maintenance  Topic Date Due   Medicare Annual Wellness (AWV)  Never done   HIV Screening  Never done   Hepatitis C Screening  Never done   Pneumococcal Vaccine 66-59 Years old (1 of 2 - PCV) Never done   COVID-19 Vaccine (5 - 2024-25 season) 03/18/2023   INFLUENZA VACCINE  02/15/2024   DTaP/Tdap/Td (2 - Td or Tdap) 02/18/2033   HPV VACCINES  Aged Out   Meningococcal B Vaccine  Aged Out    Health Maintenance  Health Maintenance Due  Topic Date Due   Medicare Annual Wellness (AWV)  Never done   HIV Screening  Never done   Hepatitis C Screening  Never done   Pneumococcal Vaccine 68-61 Years old (1 of 2 - PCV) Never done   COVID-19 Vaccine (5 - 2024-25 season)  03/18/2023       Additional Screening:  Hepatitis C Screening: does qualify; Completed N/A  Vision Screening: Recommended annual ophthalmology exams for early detection of glaucoma and other disorders of the eye. Is the patient up to date with their annual eye exam?  Yes    Dental Screening: Recommended annual dental exams for proper oral hygiene    Community Resource Referral / Chronic Care Management: CRR required this visit?  No   CCM required this visit?  No     Plan:     I have personally reviewed and noted the following in the patient's chart:   Medical and social history Use of alcohol, tobacco or illicit drugs  Current medications and supplements including opioid prescriptions. Patient is not currently taking opioid prescriptions. Functional ability and status Nutritional status Physical activity Advanced directives List of other physicians Hospitalizations, surgeries, and ER visits in previous 12 months Vitals Screenings to include cognitive, depression, and falls Referrals and appointments  In addition, I have reviewed and discussed with patient certain preventive protocols, quality metrics, and best practice recommendations. A written personalized care plan for preventive services as well as general preventive health recommendations were provided to patient.     Nasario Badder, CMA   11/26/2023   After Visit Summary: (In Person-Printed) AVS printed and given to the patient  Nurse Notes: Due to patient cognitive issues 6CIT was not completed.

## 2023-11-27 ENCOUNTER — Ambulatory Visit: Payer: Self-pay | Admitting: Nurse Practitioner

## 2023-11-28 LAB — LIPID PANEL
Chol/HDL Ratio: 3.2 ratio (ref 0.0–5.0)
Cholesterol, Total: 220 mg/dL — ABNORMAL HIGH (ref 100–199)
HDL: 68 mg/dL (ref >39)
LDL Chol Calc (NIH): 134 mg/dL — ABNORMAL HIGH (ref 0–99)
Triglycerides: 105 mg/dL (ref 0–149)
VLDL Cholesterol Cal: 18 mg/dL (ref 5–40)

## 2023-11-28 LAB — CBC WITH DIFFERENTIAL/PLATELET
Basophils Absolute: 0 10*3/uL (ref 0.0–0.2)
Basos: 1 %
EOS (ABSOLUTE): 0.1 10*3/uL (ref 0.0–0.4)
Eos: 1 %
Hematocrit: 49.8 % (ref 37.5–51.0)
Hemoglobin: 16 g/dL (ref 13.0–17.7)
Immature Grans (Abs): 0 10*3/uL (ref 0.0–0.1)
Immature Granulocytes: 0 %
Lymphocytes Absolute: 2.8 10*3/uL (ref 0.7–3.1)
Lymphs: 55 %
MCH: 29.6 pg (ref 26.6–33.0)
MCHC: 32.1 g/dL (ref 31.5–35.7)
MCV: 92 fL (ref 79–97)
Monocytes Absolute: 0.2 10*3/uL (ref 0.1–0.9)
Monocytes: 4 %
Neutrophils Absolute: 2 10*3/uL (ref 1.4–7.0)
Neutrophils: 39 %
Platelets: 250 10*3/uL (ref 150–450)
RBC: 5.4 x10E6/uL (ref 4.14–5.80)
RDW: 12.5 % (ref 11.6–15.4)
WBC: 5.2 10*3/uL (ref 3.4–10.8)

## 2023-11-28 LAB — TSH: TSH: 3.28 u[IU]/mL (ref 0.450–4.500)

## 2023-11-28 LAB — HEMOGLOBIN A1C
Est. average glucose Bld gHb Est-mCnc: 123 mg/dL
Hgb A1c MFr Bld: 5.9 % — ABNORMAL HIGH (ref 4.8–5.6)

## 2023-11-28 LAB — HIV ANTIBODY (ROUTINE TESTING W REFLEX)

## 2023-11-28 LAB — COMPREHENSIVE METABOLIC PANEL WITH GFR
ALT: 35 IU/L (ref 0–44)
AST: 20 IU/L (ref 0–40)
Albumin: 4.9 g/dL (ref 4.1–5.1)
Alkaline Phosphatase: 77 IU/L (ref 44–121)
BUN/Creatinine Ratio: 12 (ref 9–20)
BUN: 15 mg/dL (ref 6–20)
Bilirubin Total: 0.4 mg/dL (ref 0.0–1.2)
CO2: 24 mmol/L (ref 20–29)
Calcium: 9.8 mg/dL (ref 8.7–10.2)
Chloride: 99 mmol/L (ref 96–106)
Creatinine, Ser: 1.22 mg/dL (ref 0.76–1.27)
Globulin, Total: 2 g/dL (ref 1.5–4.5)
Glucose: 85 mg/dL (ref 70–99)
Potassium: 4.3 mmol/L (ref 3.5–5.2)
Sodium: 139 mmol/L (ref 134–144)
Total Protein: 6.9 g/dL (ref 6.0–8.5)
eGFR: 78 mL/min/{1.73_m2} (ref >59)

## 2023-11-28 LAB — HEPATITIS C ANTIBODY: Hep C Virus Ab: NONREACTIVE

## 2024-06-02 ENCOUNTER — Encounter: Payer: Self-pay | Admitting: Nurse Practitioner

## 2024-06-02 ENCOUNTER — Ambulatory Visit (INDEPENDENT_AMBULATORY_CARE_PROVIDER_SITE_OTHER): Admitting: Nurse Practitioner

## 2024-06-02 VITALS — BP 119/79 | HR 72 | Ht 69.02 in | Wt 185.6 lb

## 2024-06-02 DIAGNOSIS — Z23 Encounter for immunization: Secondary | ICD-10-CM

## 2024-06-02 DIAGNOSIS — K2970 Gastritis, unspecified, without bleeding: Secondary | ICD-10-CM | POA: Diagnosis not present

## 2024-06-02 NOTE — Assessment & Plan Note (Signed)
 Improved.  Only using medications as needed.  Symptoms are less severe and not as often.  Patient is doing well.  Follow up in 6 months.  Call sooner if concerns arise.

## 2024-06-02 NOTE — Progress Notes (Signed)
 BP 119/79 (BP Location: Right Arm, Patient Position: Sitting, Cuff Size: Normal)   Pulse 72   Ht 5' 9.02 (1.753 m)   Wt 185 lb 9.6 oz (84.2 kg)   SpO2 95%   BMI 27.40 kg/m    Subjective:    Patient ID: Timothy Rose, male    DOB: 12/17/1985, 38 y.o.   MRN: [id number]  HPI: Timothy Rose is a 38 y.o. male  Chief Complaint  Patient presents with   Constipation   ABDOMINAL ISSUES Patient has intermittent pain that GI feels like is a psychosomatic pain.  He had a Colonoscopy that was unremarkable.  Dad feels like the pain has improved since last visit.  States the episodes are less frequent and not as severe.  Patient reports that he does have regular bowel movements.  He is using miralax  and zofran  as needed.  Hasn't needed to use the Dexilant .      Relevant past medical, surgical, family and social history reviewed and updated as indicated. Interim medical history since our last visit reviewed. Allergies and medications reviewed and updated.  Review of Systems  Gastrointestinal:  Positive for abdominal pain and constipation.    Per HPI unless specifically indicated above     Objective:    BP 119/79 (BP Location: Right Arm, Patient Position: Sitting, Cuff Size: Normal)   Pulse 72   Ht 5' 9.02 (1.753 m)   Wt 185 lb 9.6 oz (84.2 kg)   SpO2 95%   BMI 27.40 kg/m   Wt Readings from Last 3 Encounters:  06/02/24 185 lb 9.6 oz (84.2 kg)  11/26/23 178 lb 12.8 oz (81.1 kg)  11/10/23 140 lb (63.5 kg)    Physical Exam Vitals and nursing note reviewed.  Constitutional:      General: He is not in acute distress.    Appearance: Normal appearance. He is not ill-appearing, toxic-appearing or diaphoretic.  HENT:     Head: Normocephalic.     Right Ear: External ear normal.     Left Ear: External ear normal.     Nose: Nose normal. No congestion or rhinorrhea.     Mouth/Throat:     Mouth: Mucous membranes are moist.  Eyes:     General:        Right eye: No discharge.         Left eye: No discharge.     Extraocular Movements: Extraocular movements intact.     Conjunctiva/sclera: Conjunctivae normal.     Pupils: Pupils are equal, round, and reactive to light.  Cardiovascular:     Rate and Rhythm: Normal rate and regular rhythm.     Heart sounds: No murmur heard. Pulmonary:     Effort: Pulmonary effort is normal. No respiratory distress.     Breath sounds: Normal breath sounds. No wheezing, rhonchi or rales.  Abdominal:     General: Abdomen is flat. Bowel sounds are normal. There is no distension.     Palpations: There is no mass.     Tenderness: There is no abdominal tenderness. There is no right CVA tenderness, left CVA tenderness, guarding or rebound.     Hernia: No hernia is present.  Musculoskeletal:     Cervical back: Normal range of motion and neck supple.  Skin:    General: Skin is warm and dry.     Capillary Refill: Capillary refill takes less than 2 seconds.  Neurological:     General: No focal deficit present.  Mental Status: He is alert and oriented to person, place, and time.  Psychiatric:        Mood and Affect: Mood normal.        Behavior: Behavior normal.     Results for orders placed or performed in visit on 11/26/23  TSH   Collection Time: 11/26/23  9:43 AM  Result Value Ref Range   TSH 3.280 0.450 - 4.500 uIU/mL  Lipid panel   Collection Time: 11/26/23  9:43 AM  Result Value Ref Range   Cholesterol, Total 220 (H) 100 - 199 mg/dL   Triglycerides 894 0 - 149 mg/dL   HDL 68 >60 mg/dL   VLDL Cholesterol Cal 18 5 - 40 mg/dL   LDL Chol Calc (NIH) 865 (H) 0 - 99 mg/dL   Chol/HDL Ratio 3.2 0.0 - 5.0 ratio  CBC with Differential/Platelet   Collection Time: 11/26/23  9:43 AM  Result Value Ref Range   WBC 5.2 3.4 - 10.8 x10E3/uL   RBC 5.40 4.14 - 5.80 x10E6/uL   Hemoglobin 16.0 13.0 - 17.7 g/dL   Hematocrit 50.1 62.4 - 51.0 %   MCV 92 79 - 97 fL   MCH 29.6 26.6 - 33.0 pg   MCHC 32.1 31.5 - 35.7 g/dL   RDW 87.4 88.3 -  84.5 %   Platelets 250 150 - 450 x10E3/uL   Neutrophils 39 Not Estab. %   Lymphs 55 Not Estab. %   Monocytes 4 Not Estab. %   Eos 1 Not Estab. %   Basos 1 Not Estab. %   Neutrophils Absolute 2.0 1.4 - 7.0 x10E3/uL   Lymphocytes Absolute 2.8 0.7 - 3.1 x10E3/uL   Monocytes Absolute 0.2 0.1 - 0.9 x10E3/uL   EOS (ABSOLUTE) 0.1 0.0 - 0.4 x10E3/uL   Basophils Absolute 0.0 0.0 - 0.2 x10E3/uL   Immature Granulocytes 0 Not Estab. %   Immature Grans (Abs) 0.0 0.0 - 0.1 x10E3/uL  Comprehensive metabolic panel with GFR   Collection Time: 11/26/23  9:43 AM  Result Value Ref Range   Glucose 85 70 - 99 mg/dL   BUN 15 6 - 20 mg/dL   Creatinine, Ser 8.77 0.76 - 1.27 mg/dL   eGFR 78 >40 fO/fpw/8.26   BUN/Creatinine Ratio 12 9 - 20   Sodium 139 134 - 144 mmol/L   Potassium 4.3 3.5 - 5.2 mmol/L   Chloride 99 96 - 106 mmol/L   CO2 24 20 - 29 mmol/L   Calcium 9.8 8.7 - 10.2 mg/dL   Total Protein 6.9 6.0 - 8.5 g/dL   Albumin 4.9 4.1 - 5.1 g/dL   Globulin, Total 2.0 1.5 - 4.5 g/dL   Bilirubin Total 0.4 0.0 - 1.2 mg/dL   Alkaline Phosphatase 77 44 - 121 IU/L   AST 20 0 - 40 IU/L   ALT 35 0 - 44 IU/L  Hepatitis C antibody   Collection Time: 11/26/23  9:43 AM  Result Value Ref Range   Hep C Virus Ab Non Reactive Non Reactive  HIV Antibody (routine testing w rflx)   Collection Time: 11/26/23  9:43 AM  Result Value Ref Range   HIV Screen 4th Generation wRfx Non Reactive Non Reactive  HgB A1c   Collection Time: 11/26/23  9:43 AM  Result Value Ref Range   Hgb A1c MFr Bld 5.9 (H) 4.8 - 5.6 %   Est. average glucose Bld gHb Est-mCnc 123 mg/dL      Assessment & Plan:   Problem List Items Addressed  This Visit       Digestive   Gastritis without bleeding - Primary   Improved.  Only using medications as needed.  Symptoms are less severe and not as often.  Patient is doing well.  Follow up in 6 months.  Call sooner if concerns arise.      Other Visit Diagnoses       Flu vaccine need        Relevant Orders   Flu vaccine trivalent PF, 6mos and older(Flulaval,Afluria,Fluarix,Fluzone) (Completed)         Follow up plan: Return in about 6 months (around 11/30/2024) for Physical and Fasting labs.

## 2024-06-25 DIAGNOSIS — H6981 Other specified disorders of Eustachian tube, right ear: Secondary | ICD-10-CM | POA: Diagnosis not present

## 2024-12-01 ENCOUNTER — Ambulatory Visit: Admitting: Nurse Practitioner
